# Patient Record
Sex: Female | Born: 1960
Health system: Southern US, Community
[De-identification: ages and names within clinical notes are randomized; demographics above are authoritative.]

## PROBLEM LIST (undated history)

## (undated) DIAGNOSIS — R102 Pelvic and perineal pain: Secondary | ICD-10-CM

## (undated) DIAGNOSIS — J45909 Unspecified asthma, uncomplicated: Secondary | ICD-10-CM

## (undated) DIAGNOSIS — G43909 Migraine, unspecified, not intractable, without status migrainosus: Secondary | ICD-10-CM

## (undated) DIAGNOSIS — I10 Essential (primary) hypertension: Secondary | ICD-10-CM

## (undated) DIAGNOSIS — R87629 Unspecified abnormal cytological findings in specimens from vagina: Secondary | ICD-10-CM

## (undated) DIAGNOSIS — B009 Herpesviral infection, unspecified: Secondary | ICD-10-CM

## (undated) DIAGNOSIS — N649 Disorder of breast, unspecified: Secondary | ICD-10-CM

## (undated) DIAGNOSIS — C50919 Malignant neoplasm of unspecified site of unspecified female breast: Secondary | ICD-10-CM

## (undated) DIAGNOSIS — R319 Hematuria, unspecified: Secondary | ICD-10-CM

## (undated) DIAGNOSIS — Z8619 Personal history of other infectious and parasitic diseases: Secondary | ICD-10-CM

## (undated) DIAGNOSIS — J111 Influenza due to unidentified influenza virus with other respiratory manifestations: Secondary | ICD-10-CM

## (undated) HISTORY — DX: Unspecified asthma, uncomplicated: J45.909

## (undated) HISTORY — DX: Migraine, unspecified, not intractable, without status migrainosus: G43.909

## (undated) HISTORY — PX: BREAST BIOPSY: SHX20

## (undated) HISTORY — DX: Personal history of other infectious and parasitic diseases: Z86.19

## (undated) HISTORY — DX: Pelvic and perineal pain: R10.2

## (undated) HISTORY — DX: Influenza due to unidentified influenza virus with other respiratory manifestations: J11.1

## (undated) HISTORY — DX: Unspecified abnormal cytological findings in specimens from vagina: R87.629

## (undated) HISTORY — DX: Disorder of breast, unspecified: N64.9

## (undated) HISTORY — DX: Hematuria, unspecified: R31.9

## (undated) HISTORY — DX: Malignant neoplasm of unspecified site of unspecified female breast: C50.919

## (undated) HISTORY — PX: OTHER SURGICAL HISTORY: SHX169

## (undated) HISTORY — PX: BREAST LUMPECTOMY: SHX2

## (undated) HISTORY — DX: Herpesviral infection, unspecified: B00.9

---

## 2006-03-12 ENCOUNTER — Emergency Department (HOSPITAL_COMMUNITY): Admission: EM | Admit: 2006-03-12 | Discharge: 2006-03-12 | Payer: Self-pay | Admitting: Emergency Medicine

## 2006-05-23 ENCOUNTER — Emergency Department (HOSPITAL_COMMUNITY): Admission: EM | Admit: 2006-05-23 | Discharge: 2006-05-23 | Payer: Self-pay | Admitting: Emergency Medicine

## 2006-07-31 ENCOUNTER — Emergency Department (HOSPITAL_COMMUNITY): Admission: EM | Admit: 2006-07-31 | Discharge: 2006-07-31 | Payer: Self-pay | Admitting: Emergency Medicine

## 2010-03-30 ENCOUNTER — Emergency Department (HOSPITAL_COMMUNITY): Admission: EM | Admit: 2010-03-30 | Discharge: 2010-03-30 | Payer: Self-pay | Admitting: Emergency Medicine

## 2010-04-03 ENCOUNTER — Emergency Department (HOSPITAL_COMMUNITY)
Admission: EM | Admit: 2010-04-03 | Discharge: 2010-04-03 | Payer: Self-pay | Source: Home / Self Care | Admitting: Emergency Medicine

## 2010-08-20 LAB — DIFFERENTIAL
Eosinophils Relative: 2 % (ref 0–5)
Monocytes Absolute: 0.6 10*3/uL (ref 0.1–1.0)
Monocytes Relative: 8 % (ref 3–12)
Neutro Abs: 4.6 10*3/uL (ref 1.7–7.7)

## 2010-08-20 LAB — CBC
HCT: 36.8 % (ref 36.0–46.0)
Hemoglobin: 12.6 g/dL (ref 12.0–15.0)
MCH: 26.3 pg (ref 26.0–34.0)
MCHC: 34.2 g/dL (ref 30.0–36.0)
RDW: 13.4 % (ref 11.5–15.5)

## 2010-08-20 LAB — POCT I-STAT, CHEM 8
BUN: 6 mg/dL (ref 6–23)
Chloride: 104 mEq/L (ref 96–112)
Creatinine, Ser: 0.7 mg/dL (ref 0.4–1.2)
Potassium: 3.3 mEq/L — ABNORMAL LOW (ref 3.5–5.1)
Sodium: 138 mEq/L (ref 135–145)

## 2010-08-20 LAB — POCT CARDIAC MARKERS
CKMB, poc: <1 ng/mL — ABNORMAL LOW (ref 1.0–8.0)
Myoglobin, poc: 114 ng/mL (ref 12–200)

## 2012-07-19 ENCOUNTER — Other Ambulatory Visit (HOSPITAL_COMMUNITY)
Admission: RE | Admit: 2012-07-19 | Discharge: 2012-07-19 | Disposition: A | Discharge status: Home / Self Care | Payer: BC Managed Care – PPO | Source: Ambulatory Visit | Attending: Obstetrics and Gynecology | Admitting: Obstetrics and Gynecology

## 2012-07-19 ENCOUNTER — Other Ambulatory Visit (HOSPITAL_COMMUNITY): Payer: Self-pay | Admitting: Adult Health

## 2012-07-19 DIAGNOSIS — Z01419 Encounter for gynecological examination (general) (routine) without abnormal findings: Secondary | ICD-10-CM | POA: Insufficient documentation

## 2012-07-19 DIAGNOSIS — Z1151 Encounter for screening for human papillomavirus (HPV): Secondary | ICD-10-CM | POA: Insufficient documentation

## 2012-07-19 DIAGNOSIS — N6310 Unspecified lump in the right breast, unspecified quadrant: Secondary | ICD-10-CM

## 2012-08-03 ENCOUNTER — Ambulatory Visit (HOSPITAL_COMMUNITY)
Admission: RE | Admit: 2012-08-03 | Discharge: 2012-08-03 | Disposition: A | Discharge status: Home / Self Care | Payer: BC Managed Care – PPO | Source: Ambulatory Visit | Attending: Adult Health | Admitting: Adult Health

## 2012-08-03 ENCOUNTER — Other Ambulatory Visit (HOSPITAL_COMMUNITY): Payer: Self-pay | Admitting: Adult Health

## 2012-08-03 DIAGNOSIS — N6310 Unspecified lump in the right breast, unspecified quadrant: Secondary | ICD-10-CM

## 2012-08-03 DIAGNOSIS — N63 Unspecified lump in unspecified breast: Secondary | ICD-10-CM | POA: Insufficient documentation

## 2013-01-26 ENCOUNTER — Other Ambulatory Visit: Payer: Self-pay | Admitting: Adult Health

## 2013-01-26 DIAGNOSIS — Z09 Encounter for follow-up examination after completed treatment for conditions other than malignant neoplasm: Secondary | ICD-10-CM

## 2013-01-26 DIAGNOSIS — R921 Mammographic calcification found on diagnostic imaging of breast: Secondary | ICD-10-CM

## 2013-02-15 ENCOUNTER — Other Ambulatory Visit: Payer: Self-pay | Admitting: Adult Health

## 2013-02-15 ENCOUNTER — Ambulatory Visit (HOSPITAL_COMMUNITY)
Admission: RE | Admit: 2013-02-15 | Discharge: 2013-02-15 | Disposition: A | Discharge status: Home / Self Care | Payer: BC Managed Care – PPO | Source: Ambulatory Visit | Attending: Adult Health | Admitting: Adult Health

## 2013-02-15 DIAGNOSIS — Z09 Encounter for follow-up examination after completed treatment for conditions other than malignant neoplasm: Secondary | ICD-10-CM

## 2013-02-15 DIAGNOSIS — R921 Mammographic calcification found on diagnostic imaging of breast: Secondary | ICD-10-CM

## 2013-02-15 DIAGNOSIS — R928 Other abnormal and inconclusive findings on diagnostic imaging of breast: Secondary | ICD-10-CM | POA: Insufficient documentation

## 2013-06-10 ENCOUNTER — Emergency Department (HOSPITAL_COMMUNITY)
Admission: EM | Admit: 2013-06-10 | Discharge: 2013-06-10 | Disposition: A | Discharge status: Home / Self Care | Payer: BC Managed Care – PPO | Attending: Emergency Medicine | Admitting: Emergency Medicine

## 2013-06-10 ENCOUNTER — Encounter (HOSPITAL_COMMUNITY): Payer: Self-pay | Admitting: Emergency Medicine

## 2013-06-10 ENCOUNTER — Emergency Department (HOSPITAL_COMMUNITY): Payer: BC Managed Care – PPO

## 2013-06-10 DIAGNOSIS — R1110 Vomiting, unspecified: Secondary | ICD-10-CM | POA: Insufficient documentation

## 2013-06-10 DIAGNOSIS — R0789 Other chest pain: Secondary | ICD-10-CM | POA: Insufficient documentation

## 2013-06-10 DIAGNOSIS — R300 Dysuria: Secondary | ICD-10-CM | POA: Insufficient documentation

## 2013-06-10 DIAGNOSIS — R112 Nausea with vomiting, unspecified: Secondary | ICD-10-CM | POA: Insufficient documentation

## 2013-06-10 DIAGNOSIS — J209 Acute bronchitis, unspecified: Secondary | ICD-10-CM | POA: Insufficient documentation

## 2013-06-10 DIAGNOSIS — L03119 Cellulitis of unspecified part of limb: Secondary | ICD-10-CM

## 2013-06-10 DIAGNOSIS — J40 Bronchitis, not specified as acute or chronic: Secondary | ICD-10-CM

## 2013-06-10 DIAGNOSIS — L02419 Cutaneous abscess of limb, unspecified: Secondary | ICD-10-CM | POA: Insufficient documentation

## 2013-06-10 DIAGNOSIS — L0390 Cellulitis, unspecified: Secondary | ICD-10-CM

## 2013-06-10 DIAGNOSIS — R109 Unspecified abdominal pain: Secondary | ICD-10-CM | POA: Insufficient documentation

## 2013-06-10 DIAGNOSIS — R6883 Chills (without fever): Secondary | ICD-10-CM | POA: Insufficient documentation

## 2013-06-10 DIAGNOSIS — J029 Acute pharyngitis, unspecified: Secondary | ICD-10-CM | POA: Insufficient documentation

## 2013-06-10 DIAGNOSIS — R32 Unspecified urinary incontinence: Secondary | ICD-10-CM | POA: Insufficient documentation

## 2013-06-10 DIAGNOSIS — I10 Essential (primary) hypertension: Secondary | ICD-10-CM | POA: Insufficient documentation

## 2013-06-10 HISTORY — DX: Essential (primary) hypertension: I10

## 2013-06-10 MED ORDER — DILTIAZEM HCL 100 MG IV SOLR: 5.0000 mg/h | Freq: Once | INTRAVENOUS | Status: DC

## 2013-06-10 MED ORDER — PREDNISONE 10 MG PO TABS: ORAL_TABLET | ORAL | Status: DC

## 2013-06-10 MED ORDER — AMOXICILLIN 500 MG PO CAPS: 500.0000 mg | ORAL_CAPSULE | Freq: Three times a day (TID) | ORAL | Status: DC

## 2013-06-10 MED ORDER — SULFAMETHOXAZOLE-TRIMETHOPRIM 800-160 MG PO TABS: 1.0000 | ORAL_TABLET | Freq: Two times a day (BID) | ORAL | Status: DC

## 2013-06-10 MED ORDER — DILTIAZEM HCL 25 MG/5ML IV SOLN: 5.0000 mg | Freq: Once | INTRAVENOUS | Status: DC

## 2013-06-10 MED ORDER — ALBUTEROL SULFATE HFA 108 (90 BASE) MCG/ACT IN AERS: 2.0000 | INHALATION_SPRAY | RESPIRATORY_TRACT | Status: DC | PRN

## 2013-06-10 MED ORDER — DM-GUAIFENESIN ER 30-600 MG PO TB12: 1.0000 | ORAL_TABLET | Freq: Two times a day (BID) | ORAL | Status: DC

## 2013-06-10 NOTE — ED Notes (Signed)
Pt c/o cough/congestion/n/v and abscess to right thigh

## 2013-06-10 NOTE — Discharge Instructions (Signed)
Drink plenty of fluids. Use the inhaler as needed for wheezing or shortness of breath. Take the antibiotics until gone. Take the mucinex DM for cough. Recheck if you aren't improving over the next several days.

## 2013-06-10 NOTE — ED Provider Notes (Addendum)
CSN: [id number]     Arrival date & time 06/10/13  1331 History   First MD Initiated Contact with Patient 06/10/13 1504 This chart was scribed for Janice Norrie, MD by Anastasia Pall, ED Scribe. This patient was seen in room APA08/APA08 and the patient's care was started at 3:39 PM.      Chief Complaint  Patient presents with  . Cough    The history is provided by the patient. No language interpreter was used.   HPI Comments: Catherine Hansen is a 53 y.o. female who presents to the Emergency Department complaining of  intermittent, cough, productive of clear sputum, with associated post-tussive emesis, onset 3 weeks ago that got worse 3 days ago. She also reports associated rhinorrhea and sore throat, onset today. She reports having fever and chills last week, but denies having chills this week. She reports SOB at night, with associated chest tightness and wheezing, and having to sit up to relieve the symptoms. She states she thinks her SOB is due to chest congestion and wheezing at night. She denies having inhaler/abuterol, and states she last used it 2 years ago. She reports her mild lower abdominal pain associated with coughing. She reports posttussive vomiting for the past week. She denies nausea however. She reports urinary incontinence with coughing onset yesterday. She reports she has been trying to drink a lot of ginger ale and water. She reports a few of her co-workers have been sick at the office. She reports going to Urgent Care 4 days ago, but denies receiving anything from her visit.  She also reports noticing redness over her right thigh that started yesterday and is getting bigger and more painful. She states she think something may have been bitten by something. She's never had abscesses or boils in the past.  She denies current fever, chest pain, chest tightness, diarrhea and any other associated symptoms. She denies h/o smoking, EtOH use. 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  PCP - Chip Boer, PA-C  Past Medical History  Diagnosis Date  . Hypertension    Past Surgical History  Procedure Laterality Date  . Cesarean section     No family history on file. History  Substance Use Topics  . Smoking status: Never Smoker   . Smokeless tobacco: Not on file  . Alcohol Use: No   employed   OB History   Grav Para Term Preterm Abortions TAB SAB Ect Mult Living                 Review of Systems  Constitutional: Positive for chills. Negative for fever.  HENT: Positive for congestion, rhinorrhea and sore throat.   Respiratory: Positive for cough, chest tightness (at night with her SOB) and shortness of breath (evenings).   Cardiovascular: Negative for chest pain.  Gastrointestinal: Positive for nausea, vomiting (post-tussive) and abdominal pain.  Genitourinary: Positive for dysuria.  Skin: Positive for wound (abscess, right thigh).  All other systems reviewed and are negative.    Allergies  Diphenhydramine; Hctz; and Lisinopril  Home Medications  No current outpatient prescriptions on file.  BP 129/107  Pulse 81  Temp(Src) 98.2 F (36.8 C) (Oral)  Resp 18  Ht 5' 8.5" (1.74 m)  Wt 240 lb (108.863 kg)  BMI 35.96 kg/m2  SpO2 99%  Vital signs normal    Physical Exam  Nursing note and vitals reviewed. Constitutional: She is oriented to person, place, and time. She appears well-developed and well-nourished.  Non-toxic appearance.  She does not appear ill. No distress.  HENT:  Head: Normocephalic and atraumatic.  Right Ear: External ear normal.  Left Ear: External ear normal.  Nose: Nose normal. No mucosal edema or rhinorrhea.  Mouth/Throat: Oropharynx is clear and moist and mucous membranes are normal. No dental abscesses or uvula swelling.  Tongue is dry.  Eyes: Conjunctivae and EOM are normal. Pupils are equal, round, and reactive to light.  Neck: Normal range of motion and full passive range of motion without pain. Neck supple.   Cardiovascular: Normal rate, regular rhythm and normal heart sounds.  Exam reveals no gallop and no friction rub.   No murmur heard. Pulmonary/Chest: Effort normal and breath sounds normal. No respiratory distress. She has no wheezes. She has no rhonchi. She has no rales. She exhibits no tenderness and no crepitus.  Abdominal: Soft. Normal appearance and bowel sounds are normal. She exhibits no distension. There is no tenderness. There is no rebound and no guarding.  Musculoskeletal: Normal range of motion. She exhibits no edema and no tenderness.  Moves all extremities well.   Neurological: She is alert and oriented to person, place, and time. She has normal strength. No cranial nerve deficit.  Skin: Skin is warm, dry and intact. No rash noted. No erythema. No pallor.  10x10 cm area of redness over her mid medial right thigh. No distinct area of induration. More intensely red in center.   Psychiatric: She has a normal mood and affect. Her speech is normal and behavior is normal. Her mood appears not anxious.    ED Course  Procedures (including critical care time)  DIAGNOSTIC STUDIES: Oxygen Saturation is 99% on room air, normal by my interpretation.    COORDINATION OF CARE: 3:48 PM-Discussed treatment plan which includes [place] over pt's abscess with pt at bedside and pt agreed to plan. Discussed bug bite information with pt.   Bedside ultrasound done to look for abscess cavity. The area of redness was scanned in its entirety. 2 views were obtained, one trans sagittal and one sagittal view were safer archived. The views were obtained over the enter area with the most intense redness. There is no abscess cavity visualized.    3:53 PM - Will order CXR, walking test. Pt denies needing breathing treatment, due to no SOB walking into ED.  4:50 PM - Discussed negative imaging results. Pt was ambulated by nursing staff. Pulse ox remained 98% on room air. Pt denies SOB during ambulation.  4:55 PM  - Discussed discharge with pt.   Labs Review Labs Reviewed - No data to display Imaging Review Dg Chest 2 View  06/10/2013   CLINICAL DATA:  Cough for 2 weeks. Abdominal pain with nausea and vomiting.  EXAM: CHEST  2 VIEW  COMPARISON:  CT CHEST W/CM dated 04/03/2010; DG CHEST 2 VIEW dated 04/03/2010  FINDINGS: There is suboptimal inspiration on the frontal examination. The heart size and mediastinal contours are stable. There is mild bibasilar atelectasis. No edema, confluent airspace opacity or pleural effusion is demonstrated. The osseous structures appear intact.  IMPRESSION: Stable mild cardiomegaly and basilar atelectasis. No acute cardiopulmonary process.   Electronically Signed   By: Camie Patience M.D.   On: 06/10/2013 16:26    EKG Interpretation   None       MDM   1. Bronchitis   2. Cellulitis     New Prescriptions   ALBUTEROL (PROVENTIL HFA;VENTOLIN HFA) 108 (90 BASE) MCG/ACT INHALER    Inhale 2 puffs into the  lungs every 4 (four) hours as needed for wheezing or shortness of breath.   AMOXICILLIN (AMOXIL) 500 MG CAPSULE    Take 1 capsule (500 mg total) by mouth 3 (three) times daily.   DEXTROMETHORPHAN-GUAIFENESIN (MUCINEX DM) 30-600 MG PER 12 HR TABLET    Take 1 tablet by mouth 2 (two) times daily.   PREDNISONE (DELTASONE) 20 MG TABLET    Take 3 po QD x 3d , then 2 po QD x 3d then 1 po QD x 3d   SULFAMETHOXAZOLE-TRIMETHOPRIM (SEPTRA DS) 800-160 MG PER TABLET    Take 1 tablet by mouth every 12 (twelve) hours.    Plan discharge  Rolland Porter, MD, FACEP    I personally performed the services described in this documentation, which was scribed in my presence. The recorded information has been reviewed and considered.  Rolland Porter, MD, [place], MD 06/10/13 [place], MD 06/10/13 [place], MD 06/10/13 [id number]

## 2013-08-08 ENCOUNTER — Encounter (INDEPENDENT_AMBULATORY_CARE_PROVIDER_SITE_OTHER): Payer: Self-pay

## 2013-08-08 ENCOUNTER — Encounter: Payer: Self-pay | Admitting: Adult Health

## 2013-08-08 ENCOUNTER — Ambulatory Visit (INDEPENDENT_AMBULATORY_CARE_PROVIDER_SITE_OTHER): Payer: BC Managed Care – PPO | Admitting: Adult Health

## 2013-08-08 VITALS — BP 112/70 | HR 76 | Ht 68.5 in | Wt 242.0 lb

## 2013-08-08 DIAGNOSIS — Z8619 Personal history of other infectious and parasitic diseases: Secondary | ICD-10-CM

## 2013-08-08 DIAGNOSIS — Z01419 Encounter for gynecological examination (general) (routine) without abnormal findings: Secondary | ICD-10-CM

## 2013-08-08 DIAGNOSIS — Z1212 Encounter for screening for malignant neoplasm of rectum: Secondary | ICD-10-CM

## 2013-08-08 HISTORY — DX: Personal history of other infectious and parasitic diseases: Z86.19

## 2013-08-08 LAB — HEMOCCULT GUIAC POC 1CARD (OFFICE): FECAL OCCULT BLD: NEGATIVE

## 2013-08-08 MED ORDER — VALACYCLOVIR HCL 500 MG PO TABS: 500.0000 mg | ORAL_TABLET | Freq: Two times a day (BID) | ORAL | Status: DC

## 2013-08-08 NOTE — Progress Notes (Signed)
Patient ID: Catherine Hansen, female   DOB: 1961/04/23, 53 y.o.   MRN: [id number] History of Present Illness: Catherine Hansen is a 53 year old black female in for a physical, she hand a normal pap with negative HPV 07/19/12 and labs then also.   Current Medications, Allergies, Past Medical History, Past Surgical History, Family History and Social History were reviewed in [employer] record.     Review of Systems: Patient denies any blurred vision, shortness of breath, chest pain, abdominal pain, problems with bowel movements, urination, or intercourse, not having sex. No joint pain or mood swings.LMP 02/2012 then spotted in mid summer 2014 done since then.Has some frontal headaches will get eye exam.Has some leg cramps and take yellow mustard.    Physical Exam:BP 112/70  Pulse 76  Ht 5' 8.5" (1.74 m)  Wt 242 lb (109.77 kg)  BMI 36.26 kg/m2 General:  Well developed, well nourished, no acute distress Skin:  Warm and dry Neck:  Midline trachea, normal thyroid Lungs; Clear to auscultation bilaterally Breast:  No dominant palpable mass, retraction, or nipple discharge Cardiovascular: Regular rate and rhythm Abdomen:  Soft, non tender, no hepatosplenomegaly Pelvic:  External genitalia is normal in appearance.  The vagina is normal in appearance.  The cervix is bulbous.  Uterus is felt to be normal size, shape, and contour.  No adnexal masses or tenderness noted. Rectal: Good sphincter tone, no polyps, or hemorrhoids felt.  Hemoccult negative. Extremities:  No swelling or varicosities noted Psych:  No mood changes, alert and cooperative,seems happy   Impression: Yearly gyn exam no pap History of herpes    Plan: Refilled valtrex prn Physical in 1 year Mammogram now and yearly Colonoscopy advised Labs at [facility]

## 2013-08-08 NOTE — Patient Instructions (Signed)
Physical yearly Mammogram yearly [phone] Get colonoscopy  Labs with PCP

## 2013-08-24 ENCOUNTER — Encounter: Payer: Self-pay | Admitting: Adult Health

## 2013-09-13 ENCOUNTER — Ambulatory Visit (HOSPITAL_COMMUNITY)
Admission: RE | Admit: 2013-09-13 | Discharge: 2013-09-13 | Disposition: A | Discharge status: Home / Self Care | Payer: BC Managed Care – PPO | Source: Ambulatory Visit | Attending: Adult Health | Admitting: Adult Health

## 2013-09-13 DIAGNOSIS — Z09 Encounter for follow-up examination after completed treatment for conditions other than malignant neoplasm: Secondary | ICD-10-CM | POA: Insufficient documentation

## 2013-09-13 DIAGNOSIS — R928 Other abnormal and inconclusive findings on diagnostic imaging of breast: Secondary | ICD-10-CM | POA: Insufficient documentation

## 2014-04-09 ENCOUNTER — Encounter: Payer: Self-pay | Admitting: Adult Health

## 2014-08-14 ENCOUNTER — Other Ambulatory Visit: Payer: Self-pay | Admitting: Adult Health

## 2014-08-21 ENCOUNTER — Encounter: Payer: Self-pay | Admitting: Adult Health

## 2014-08-21 ENCOUNTER — Ambulatory Visit (INDEPENDENT_AMBULATORY_CARE_PROVIDER_SITE_OTHER): Payer: BLUE CROSS/BLUE SHIELD | Admitting: Adult Health

## 2014-08-21 VITALS — BP 124/78 | HR 76 | Ht 67.5 in | Wt 236.0 lb

## 2014-08-21 DIAGNOSIS — Z01419 Encounter for gynecological examination (general) (routine) without abnormal findings: Secondary | ICD-10-CM | POA: Diagnosis not present

## 2014-08-21 DIAGNOSIS — Z1212 Encounter for screening for malignant neoplasm of rectum: Secondary | ICD-10-CM

## 2014-08-21 LAB — HEMOCCULT GUIAC POC 1CARD (OFFICE): FECAL OCCULT BLD: NEGATIVE

## 2014-08-21 NOTE — Progress Notes (Signed)
Patient ID: Catherine Hansen, female   DOB: 04-06-1961, 54 y.o.   MRN: [id number] History of Present Illness: Terecia is a 54 year old black female in for well woman gyn exam.She had a normal pap with negative HPV 07/19/12.   Current Medications, Allergies, Past Medical History, Past Surgical History, Family History and Social History were reviewed in [employer] record.     Review of Systems: Patient denies any headaches, hearing loss, fatigue, blurred vision, shortness of breath, chest pain, abdominal pain, problems with bowel movements, urination, or intercourse. No mood swings.She has swelling and some pain in right knee, had injury after Christmas and has seen PCP.Had headache and had BP meds changed and is good.    Physical Exam:BP 124/78 mmHg  Pulse 76  Ht 5' 7.5" (1.715 m)  Wt 236 lb (107.049 kg)  BMI 36.40 kg/m2 General:  Well developed, well nourished, no acute distress Skin:  Warm and dry Neck:  Midline trachea, normal thyroid, good ROM, no lymphadenopathy Lungs; Clear to auscultation bilaterally Breast:  No dominant palpable mass, retraction, or nipple discharge Cardiovascular: Regular rate and rhythm Abdomen:  Soft, non tender, no hepatosplenomegaly Pelvic:  External genitalia is normal in appearance, no lesions.  The vagina is normal in appearance. Urethra has no lesions or masses. The cervix is smooth.  Uterus is felt to be normal size, shape, and contour.  No adnexal masses or tenderness noted.Bladder is non tender, no masses felt. Rectal: Good sphincter tone, no polyps, or hemorrhoids felt.  Hemoccult negative. Extremities/musculoskeletal:  No swelling or varicosities noted, no clubbing or cyanosis Psych:  No mood changes, alert and cooperative,seems happy   Impression: Well woman gyn exam no pap   Plan: Pap and physical in 1 year  Mammogram yearly  Labs with PCP Colonoscopy advised again If has sex use condoms and astro glide

## 2014-08-21 NOTE — Patient Instructions (Addendum)
Physical and pap in 1 year Mammogram yearly  Labs with PCP Colonoscopy advised USe condoms  Try ASTRO GLIDE if has sex

## 2014-09-04 ENCOUNTER — Telehealth: Payer: Self-pay | Admitting: Adult Health

## 2014-09-04 ENCOUNTER — Other Ambulatory Visit: Payer: Self-pay | Admitting: Adult Health

## 2014-09-04 NOTE — Telephone Encounter (Signed)
Spoke with pt. Pt is needing a refill on Valtrex. She uses [facility] in [place]. Thanks!! [place]

## 2014-09-04 NOTE — Telephone Encounter (Signed)
Refilled valtrex x 3

## 2014-09-25 ENCOUNTER — Telehealth: Payer: Self-pay | Admitting: Adult Health

## 2014-09-25 NOTE — Telephone Encounter (Signed)
Left message x 1. JSY 

## 2014-09-28 NOTE — Telephone Encounter (Signed)
Pt called wanting the Valtrex rx to say prn refills instead of 3 refills. I looked back in her chart and last physical appt, 08/08/13,  she was given prn refills. I spoke with Chrystal, RN and she advised it should be fine to change to prn refills. [facility] notified and it was changed. [place]

## 2014-10-17 ENCOUNTER — Other Ambulatory Visit: Payer: Self-pay | Admitting: Adult Health

## 2014-10-17 DIAGNOSIS — R921 Mammographic calcification found on diagnostic imaging of breast: Secondary | ICD-10-CM

## 2014-11-06 ENCOUNTER — Ambulatory Visit (HOSPITAL_COMMUNITY)
Admission: RE | Admit: 2014-11-06 | Discharge: 2014-11-06 | Disposition: A | Discharge status: Home / Self Care | Payer: BLUE CROSS/BLUE SHIELD | Source: Ambulatory Visit | Attending: Adult Health | Admitting: Adult Health

## 2014-11-06 ENCOUNTER — Ambulatory Visit (HOSPITAL_COMMUNITY)
Admission: RE | Admit: 2014-11-06 | Discharge: 2014-11-06 | Disposition: A | Discharge status: Home / Self Care | Payer: BLUE CROSS/BLUE SHIELD | Source: Ambulatory Visit | Attending: Family Medicine | Admitting: Family Medicine

## 2014-11-06 ENCOUNTER — Other Ambulatory Visit (HOSPITAL_COMMUNITY): Payer: Self-pay | Admitting: Family Medicine

## 2014-11-06 DIAGNOSIS — R928 Other abnormal and inconclusive findings on diagnostic imaging of breast: Secondary | ICD-10-CM | POA: Diagnosis present

## 2014-11-06 DIAGNOSIS — R921 Mammographic calcification found on diagnostic imaging of breast: Secondary | ICD-10-CM

## 2014-11-06 DIAGNOSIS — N6320 Unspecified lump in the left breast, unspecified quadrant: Secondary | ICD-10-CM

## 2014-12-25 ENCOUNTER — Ambulatory Visit (INDEPENDENT_AMBULATORY_CARE_PROVIDER_SITE_OTHER): Payer: BLUE CROSS/BLUE SHIELD | Admitting: Adult Health

## 2014-12-25 ENCOUNTER — Encounter: Payer: Self-pay | Admitting: Adult Health

## 2014-12-25 VITALS — BP 130/78 | HR 76 | Ht 68.5 in | Wt 233.5 lb

## 2014-12-25 DIAGNOSIS — R319 Hematuria, unspecified: Secondary | ICD-10-CM

## 2014-12-25 DIAGNOSIS — R102 Pelvic and perineal pain: Secondary | ICD-10-CM | POA: Diagnosis not present

## 2014-12-25 HISTORY — DX: Pelvic and perineal pain: R10.2

## 2014-12-25 HISTORY — DX: Hematuria, unspecified: R31.9

## 2014-12-25 LAB — POCT URINALYSIS DIPSTICK
Glucose, UA: NEGATIVE
Leukocytes, UA: NEGATIVE
Nitrite, UA: NEGATIVE
Protein, UA: NEGATIVE

## 2014-12-25 MED ORDER — HYDROCODONE-ACETAMINOPHEN 5-325 MG PO TABS: 1.0000 | ORAL_TABLET | Freq: Four times a day (QID) | ORAL | Status: DC | PRN

## 2014-12-25 NOTE — Patient Instructions (Signed)
Hematuria Hematuria is blood in your urine. It can be caused by a bladder infection, kidney infection, prostate infection, kidney stone, or cancer of your urinary tract. Infections can usually be treated with medicine, and a kidney stone usually will pass through your urine. If neither of these is the cause of your hematuria, further workup to find out the reason may be needed. It is very important that you tell your health care provider about any blood you see in your urine, even if the blood stops without treatment or happens without causing pain. Blood in your urine that happens and then stops and then happens again can be a symptom of a very serious condition. Also, pain is not a symptom in the initial stages of many urinary cancers. HOME CARE INSTRUCTIONS   Drink lots of fluid, 3-4 quarts a day. If you have been diagnosed with an infection, cranberry juice is especially recommended, in addition to large amounts of water.  Avoid caffeine, tea, and carbonated beverages because they tend to irritate the bladder.  Avoid alcohol because it may irritate the prostate.  Take all medicines as directed by your health care provider.  If you were prescribed an antibiotic medicine, finish it all even if you start to feel better.  If you have been diagnosed with a kidney stone, follow your health care provider's instructions regarding straining your urine to catch the stone.  Empty your bladder often. Avoid holding urine for long periods of time.  After a bowel movement, women should cleanse front to back. Use each tissue only once.  Empty your bladder before and after sexual intercourse if you are a female. SEEK MEDICAL CARE IF:  You develop back pain.  You have a fever.  You have a feeling of sickness in your stomach (nausea) or vomiting.  Your symptoms are not better in 3 days. Return sooner if you are getting worse. SEEK IMMEDIATE MEDICAL CARE IF:   You develop severe vomiting and are  unable to keep the medicine down.  You develop severe back or abdominal pain despite taking your medicines.  You begin passing a large amount of blood or clots in your urine.  You feel extremely weak or faint, or you pass out. MAKE SURE YOU:   Understand these instructions.  Will watch your condition.  Will get help right away if you are not doing well or get worse. Document Released: 05/25/2005 Document Revised: 10/09/2013 Document Reviewed: 01/23/2013 [facility] Patient Information 2015 [place], [place]. This information is not intended to replace advice given to you by your health care provider. Make sure you discuss any questions you have with your health care provider. Pelvic Pain Female pelvic pain can be caused by many different things and start from a variety of places. Pelvic pain refers to pain that is located in the lower half of the abdomen and between your hips. The pain may occur over a short period of time (acute) or may be reoccurring (chronic). The cause of pelvic pain may be related to disorders affecting the female reproductive organs (gynecologic), but it may also be related to the bladder, kidney stones, an intestinal complication, or muscle or skeletal problems. Getting help right away for pelvic pain is important, especially if there has been severe, sharp, or a sudden onset of unusual pain. It is also important to get help right away because some types of pelvic pain can be life threatening.  CAUSES  Below are only some of the causes of pelvic pain. The  causes of pelvic pain can be in one of several categories.   Gynecologic.  Pelvic inflammatory disease.  Sexually transmitted infection.  Ovarian cyst or a twisted ovarian ligament (ovarian torsion).  Uterine lining that grows outside the uterus (endometriosis).  Fibroids, cysts, or tumors.  Ovulation.  Pregnancy.  Pregnancy that occurs outside the uterus (ectopic  pregnancy).  Miscarriage.  Labor.  Abruption of the placenta or ruptured uterus.  Infection.  Uterine infection (endometritis).  Bladder infection.  Diverticulitis.  Miscarriage related to a uterine infection (septic abortion).  Bladder.  Inflammation of the bladder (cystitis).  Kidney stone(s).  Gastrointestinal.  Constipation.  Diverticulitis.  Neurologic.  Trauma.  Feeling pelvic pain because of mental or emotional causes (psychosomatic).  Cancers of the bowel or pelvis. EVALUATION  Your caregiver will want to take a careful history of your concerns. This includes recent changes in your health, a careful gynecologic history of your periods (menses), and a sexual history. Obtaining your family history and medical history is also important. Your caregiver may suggest a pelvic exam. A pelvic exam will help identify the location and severity of the pain. It also helps in the evaluation of which organ system may be involved. In order to identify the cause of the pelvic pain and be properly treated, your caregiver may order tests. These tests may include:   A pregnancy test.  Pelvic ultrasonography.  An X-ray exam of the abdomen.  A urinalysis or evaluation of vaginal discharge.  Blood tests. HOME CARE INSTRUCTIONS   Only take over-the-counter or prescription medicines for pain, discomfort, or fever as directed by your caregiver.   Rest as directed by your caregiver.   Eat a balanced diet.   Drink enough fluids to make your urine clear or pale yellow, or as directed.   Avoid sexual intercourse if it causes pain.   Apply warm or cold compresses to the lower abdomen depending on which one helps the pain.   Avoid stressful situations.   Keep a journal of your pelvic pain. Write down when it started, where the pain is located, and if there are things that seem to be associated with the pain, such as food or your menstrual cycle.  Follow up with your  caregiver as directed.  SEEK MEDICAL CARE IF:  Your medicine does not help your pain.  You have abnormal vaginal discharge. SEEK IMMEDIATE MEDICAL CARE IF:   You have heavy bleeding from the vagina.   Your pelvic pain increases.   You feel light-headed or faint.   You have chills.   You have pain with urination or blood in your urine.   You have uncontrolled diarrhea or vomiting.   You have a fever or persistent symptoms for more than 3 days.  You have a fever and your symptoms suddenly get worse.   You are being physically or sexually abused.  MAKE SURE YOU:  Understand these instructions.  Will watch your condition.  Will get help if you are not doing well or get worse. Document Released: 04/21/2004 Document Revised: 10/09/2013 Document Reviewed: 09/14/2011 [facility] Patient Information 2015 [place], [place]. This information is not intended to replace advice given to you by your health care provider. Make sure you discuss any questions you have with your health care provider. Return for gyn [place]

## 2014-12-25 NOTE — Progress Notes (Signed)
Subjective:     Patient ID: Catherine Hansen, female   DOB: Jun 19, 1960, 54 y.o.   MRN: [id number]  HPI Catherine Hansen is a 54 year old black female in complaining of pelvic pain and pressure on and off for 2 months, worse in last 2 weeks, has had sex and it was uncomfortable, no problems with peeing or bowel movements.  Review of Systems Patient denies any headaches, hearing loss, fatigue, blurred vision, shortness of breath, chest pain, problems with bowel movements, urination, or intercourse. No joint pain or mood swings.See HPI for positives. Reviewed past medical,surgical, social and family history. Reviewed medications and allergies.     Objective:   Physical Exam BP 130/78 mmHg  Pulse 76  Ht 5' 8.5" (1.74 m)  Wt 233 lb 8 oz (105.915 kg)  BMI 34.98 kg/m2 urine dipstick:trace blood, Skin warm and dry.Pelvic: external genitalia is normal in appearance no lesions, vagina: scant discharge without odor,urethra has no lesions or masses noted, cervix:smooth and bulbous, uterus: normal size, shape and contour, non tender, no masses felt, adnexa: no masses, mild tenderness noted, across but more to right. Bladder is non tender and no masses felt.  GC/CHL obtained.     Assessment:     Pelvic pain Hematuria     Plan:    Push fluids  UA C&S sent GC/CHL sent Return for GYN [place] in 2 days or so, will talk when results back Rx norco 5-325 mg #30 take 1 every 6 hours prn severe pain, no refills, can use tylenol if mild pain Review handout on pelvic pain and hematuria

## 2014-12-26 LAB — URINALYSIS, ROUTINE W REFLEX MICROSCOPIC
BILIRUBIN UA: NEGATIVE
GLUCOSE, UA: NEGATIVE
Ketones, UA: NEGATIVE
LEUKOCYTES UA: NEGATIVE
Nitrite, UA: NEGATIVE
PH UA: 7 (ref 5.0–7.5)
PROTEIN UA: NEGATIVE
RBC, UA: NEGATIVE
Specific Gravity, UA: 1.012 (ref 1.005–1.030)
Urobilinogen, Ur: 0.2 mg/dL (ref 0.2–1.0)

## 2014-12-26 LAB — GC/CHLAMYDIA PROBE AMP
CHLAMYDIA, DNA PROBE: NEGATIVE
Neisseria gonorrhoeae by PCR: NEGATIVE

## 2014-12-27 ENCOUNTER — Ambulatory Visit (INDEPENDENT_AMBULATORY_CARE_PROVIDER_SITE_OTHER): Payer: BLUE CROSS/BLUE SHIELD

## 2014-12-27 DIAGNOSIS — R102 Pelvic and perineal pain unspecified side: Secondary | ICD-10-CM

## 2014-12-27 LAB — URINE CULTURE

## 2014-12-27 NOTE — Progress Notes (Signed)
US PELVIC TA/TV: heterogenous anteverted uterus w/a 4 x 3.9 x 3.7cm central fibroid(submucosal),unable to see endometrium because of fibroid,(#2) fundal intramural  fibroid 1.4 x 1.9 x 1.1cm,normal ov's bilat, ov's appear to be mobile,no pain during [place].

## 2014-12-28 ENCOUNTER — Telehealth: Payer: Self-pay | Admitting: Adult Health

## 2014-12-28 NOTE — Telephone Encounter (Signed)
Pt aware of urine and [place] results

## 2015-04-10 ENCOUNTER — Other Ambulatory Visit: Payer: Self-pay | Admitting: Adult Health

## 2015-04-10 DIAGNOSIS — Z09 Encounter for follow-up examination after completed treatment for conditions other than malignant neoplasm: Secondary | ICD-10-CM

## 2015-05-14 ENCOUNTER — Ambulatory Visit (HOSPITAL_COMMUNITY)
Admission: RE | Admit: 2015-05-14 | Discharge: 2015-05-14 | Disposition: A | Discharge status: Home / Self Care | Payer: BLUE CROSS/BLUE SHIELD | Source: Ambulatory Visit | Attending: Adult Health | Admitting: Adult Health

## 2015-05-14 DIAGNOSIS — Z09 Encounter for follow-up examination after completed treatment for conditions other than malignant neoplasm: Secondary | ICD-10-CM | POA: Insufficient documentation

## 2015-05-14 DIAGNOSIS — R928 Other abnormal and inconclusive findings on diagnostic imaging of breast: Secondary | ICD-10-CM | POA: Diagnosis not present

## 2015-08-27 ENCOUNTER — Encounter: Payer: Self-pay | Admitting: Adult Health

## 2015-08-27 ENCOUNTER — Ambulatory Visit (INDEPENDENT_AMBULATORY_CARE_PROVIDER_SITE_OTHER): Payer: BLUE CROSS/BLUE SHIELD | Admitting: Adult Health

## 2015-08-27 ENCOUNTER — Other Ambulatory Visit (HOSPITAL_COMMUNITY)
Admission: RE | Admit: 2015-08-27 | Discharge: 2015-08-27 | Disposition: A | Discharge status: Home / Self Care | Payer: BLUE CROSS/BLUE SHIELD | Source: Ambulatory Visit | Attending: Adult Health | Admitting: Adult Health

## 2015-08-27 VITALS — BP 132/80 | HR 102 | Temp 101.7°F | Ht 68.5 in | Wt 227.0 lb

## 2015-08-27 DIAGNOSIS — Z1151 Encounter for screening for human papillomavirus (HPV): Secondary | ICD-10-CM | POA: Insufficient documentation

## 2015-08-27 DIAGNOSIS — J111 Influenza due to unidentified influenza virus with other respiratory manifestations: Secondary | ICD-10-CM

## 2015-08-27 DIAGNOSIS — Z01411 Encounter for gynecological examination (general) (routine) with abnormal findings: Secondary | ICD-10-CM | POA: Diagnosis present

## 2015-08-27 DIAGNOSIS — Z1212 Encounter for screening for malignant neoplasm of rectum: Secondary | ICD-10-CM

## 2015-08-27 DIAGNOSIS — Z113 Encounter for screening for infections with a predominantly sexual mode of transmission: Secondary | ICD-10-CM | POA: Insufficient documentation

## 2015-08-27 DIAGNOSIS — Z01419 Encounter for gynecological examination (general) (routine) without abnormal findings: Secondary | ICD-10-CM | POA: Diagnosis not present

## 2015-08-27 HISTORY — DX: Influenza due to unidentified influenza virus with other respiratory manifestations: J11.1

## 2015-08-27 LAB — HEMOCCULT GUIAC POC 1CARD (OFFICE): Fecal Occult Blood, POC: NEGATIVE

## 2015-08-27 MED ORDER — OSELTAMIVIR PHOSPHATE 75 MG PO CAPS: 75.0000 mg | ORAL_CAPSULE | Freq: Two times a day (BID) | ORAL | Status: DC

## 2015-08-27 MED ORDER — PROMETHAZINE HCL 25 MG PO TABS: 25.0000 mg | ORAL_TABLET | Freq: Four times a day (QID) | ORAL | Status: DC | PRN

## 2015-08-27 MED ORDER — VALACYCLOVIR HCL 500 MG PO TABS: ORAL_TABLET | ORAL | Status: DC

## 2015-08-27 NOTE — Progress Notes (Addendum)
Patient ID: Catherine Hansen, female   DOB: 05-May-1961, 55 y.o.   MRN: [id number] History of Present Illness: Catherine Hansen is a  55 year old black female in for a well woman gyn exam and pap.She says she started feeling bad yesterday afternoon, has cough, fever and vomiting with body aches, thinks she has the flu.She says her stomach is sore too.Denies any diarrhea.She works in home health care. PCP is [facility].   Current Medications, Allergies, Past Medical History, Past Surgical History, Family History and Social History were reviewed in [employer] record.     Review of Systems: Patient denies any headaches, hearing loss, fatigue, blurred vision, shortness of breath, chest pain, problems with bowel movements, urination, or intercourse. No joint pain or mood swings. See HPI for positives.   Physical Exam:BP 132/80 mmHg  Pulse 102  Temp(Src) 101.7 F (38.7 C)  Ht 5' 8.5" (1.74 m)  Wt 227 lb (102.967 kg)  BMI 34.01 kg/m2 General:  Well developed, well nourished, no acute distress Skin:  Warm and dry Neck:  Midline trachea, normal thyroid, good ROM, no lymphadenopathy, ears and throat are clear, no redness or swelling Lungs; Clear to auscultation bilaterally Breast:  No dominant palpable mass, retraction, or nipple discharge Cardiovascular: Regular rate and rhythm Abdomen:  Soft, non tender, no hepatosplenomegaly Pelvic:  External genitalia is normal in appearance, no lesions.  The vagina is normal in appearance. Urethra has no lesions or masses. The cervix is bulbous. Pap with HPV and GC/CHL performed. Uterus is felt to be normal size, shape, and contour.  No adnexal masses, mild  tenderness noted.Bladder is non tender, no masses felt. Rectal: Good sphincter tone, no polyps, or hemorrhoids felt.  Hemoccult negative. Extremities/musculoskeletal:  No swelling or varicosities noted, no clubbing or cyanosis Psych:  No mood changes, alert and cooperative,seems  happy   Impression: Well woman gyn exam and pap Flu     Plan: Rx tamiflu 75 mg #10 take 1 bid Refilled valtrex 1 gm 1 bid #10 with 3 refills Rx phenergan 25 mg #30 take 1 every 6 hours prn with 1 refill Push fluids, take delsym and tylenol and follow up with [facility] if needed Mammogram yearly Labs with PCP Hhysical in 1 year, pap in 3 if normal Colonoscopy advised

## 2015-08-27 NOTE — Patient Instructions (Addendum)
Physical in 1 year Mammogram yearly Labs with [facility] Colonoscopy Advised Rest push fluids and take tamiflu, tylenol  Follow up with [facility] Try delsym

## 2015-08-29 LAB — CYTOLOGY - PAP

## 2015-11-13 ENCOUNTER — Telehealth: Payer: Self-pay | Admitting: *Deleted

## 2015-11-13 DIAGNOSIS — R928 Other abnormal and inconclusive findings on diagnostic imaging of breast: Secondary | ICD-10-CM

## 2015-11-13 NOTE — Telephone Encounter (Signed)
Order in for mammogram and [place] left breast, appt  6/20 at 2:10

## 2015-11-13 NOTE — Telephone Encounter (Signed)
Pt aware appt 6/20 at 2 :20 pm at [facility]

## 2015-11-19 ENCOUNTER — Other Ambulatory Visit: Payer: Self-pay | Admitting: Adult Health

## 2015-11-19 DIAGNOSIS — R928 Other abnormal and inconclusive findings on diagnostic imaging of breast: Secondary | ICD-10-CM

## 2015-11-26 ENCOUNTER — Ambulatory Visit (HOSPITAL_COMMUNITY)
Admission: RE | Admit: 2015-11-26 | Discharge: 2015-11-26 | Disposition: A | Discharge status: Home / Self Care | Payer: BLUE CROSS/BLUE SHIELD | Source: Ambulatory Visit | Attending: Adult Health | Admitting: Adult Health

## 2015-11-26 ENCOUNTER — Other Ambulatory Visit: Payer: Self-pay | Admitting: Adult Health

## 2015-11-26 DIAGNOSIS — R928 Other abnormal and inconclusive findings on diagnostic imaging of breast: Secondary | ICD-10-CM | POA: Diagnosis present

## 2015-11-26 DIAGNOSIS — R921 Mammographic calcification found on diagnostic imaging of breast: Secondary | ICD-10-CM | POA: Diagnosis not present

## 2015-12-03 ENCOUNTER — Ambulatory Visit
Admission: RE | Admit: 2015-12-03 | Discharge: 2015-12-03 | Disposition: A | Discharge status: Home / Self Care | Payer: BLUE CROSS/BLUE SHIELD | Source: Ambulatory Visit | Attending: Adult Health | Admitting: Adult Health

## 2015-12-03 DIAGNOSIS — R921 Mammographic calcification found on diagnostic imaging of breast: Secondary | ICD-10-CM

## 2015-12-09 ENCOUNTER — Encounter: Payer: Self-pay | Admitting: Adult Health

## 2015-12-09 ENCOUNTER — Telehealth: Payer: Self-pay | Admitting: Adult Health

## 2015-12-09 DIAGNOSIS — C50919 Malignant neoplasm of unspecified site of unspecified female breast: Secondary | ICD-10-CM

## 2015-12-09 DIAGNOSIS — D0501 Lobular carcinoma in situ of right breast: Secondary | ICD-10-CM | POA: Insufficient documentation

## 2015-12-09 HISTORY — DX: Malignant neoplasm of unspecified site of unspecified female breast: C50.919

## 2015-12-09 NOTE — Telephone Encounter (Signed)
Pt aware that biopsy of breast showed cancer and she has appt, 7/14 with [facility]

## 2015-12-20 ENCOUNTER — Ambulatory Visit: Payer: Self-pay | Admitting: Surgery

## 2015-12-20 DIAGNOSIS — D0501 Lobular carcinoma in situ of right breast: Secondary | ICD-10-CM

## 2015-12-20 NOTE — H&P (Signed)
History of Present Illness Catherine Hansen. Catherine Archambeault MD; 12/20/2015 12:24 PM) The patient is a 55 year old female who presents with a complaint of Breast problems. Referred by Catherine Hansen for LCIS right breast PCP - Catherine Hansen  This is a 55 year old female who recently underwent short-term follow-up mammogram for left breast asymmetry. Her mammogram was performed on 11/26/15. She had a new finding of a 3 mm area of calcifications in the upper outer right breast. She has persistent asymmetry in the left breast that is unchanged. She underwent stereotactic biopsy on 12/03/15 with placement of a clip. 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 She presents now for surgical evaluation.  Menarche - age 35 First pregnancy - age 35 Breastfeed - no Hormones - OCP 19 years Menopause - age 49 Previous core biopsy right breast Catherine Hansen - benign FH - no breast cancer; mother - ovarian cancer at young age  CLINICAL DATA: Patient for short-term follow-up probably benign left breast asymmetry.  EXAM: 2D DIGITAL DIAGNOSTIC BILATERAL MAMMOGRAM WITH CAD AND ADJUNCT TOMO  ULTRASOUND LEFT BREAST  COMPARISON: Previous exam(s).  ACR Breast Density Category b: There are scattered areas of fibroglandular density.  FINDINGS: Magnification CC and true lateral views of the left breast demonstrate a new 3 mm group of round calcifications within the upper-outer right breast middle depth. Persistent asymmetry within the medial left breast middle depth. No additional concerning masses or areas of architectural distortion identified within either breast.  Mammographic images were processed with CAD.  On physical exam, I palpate no discrete mass within the medial left breast.  Targeted ultrasound is performed, showing no definite sonographic correlate for focal asymmetry medial left breast.  IMPRESSION: New group of rounded  calcifications within the upper-outer right breast, indeterminate.  Stable but probably benign focal asymmetry left breast.  RECOMMENDATION: Stereotactic guided core needle biopsy right breast calcifications.  One additional follow-up mammogram of left breast asymmetry in 12 months.  I have discussed the findings and recommendations with the patient. Results were also provided in writing at the conclusion of the visit. If applicable, a reminder letter will be sent to the patient regarding the next appointment.  BI-RADS CATEGORY 4: Suspicious.   Electronically Signed By: Catherine Hansen M.D. On: 11/26/2015 15:05  CLINICAL DATA: Calcifications right breast for which stereotactic biopsy was recommended.  EXAM: RIGHT BREAST STEREOTACTIC CORE NEEDLE BIOPSY  COMPARISON: Previous exams.  FINDINGS: The patient and I discussed the procedure of stereotactic-guided biopsy including benefits and alternatives. We discussed the high likelihood of a successful procedure. We discussed the risks of the procedure including infection, bleeding, tissue injury, clip migration, and inadequate sampling. Informed written consent was given. The usual time out protocol was performed immediately prior to the procedure.  Using sterile technique and 1% Lidocaine as local anesthetic, under stereotactic guidance, a 9 gauge vacuum assisted device was used to perform core needle biopsy of calcifications in the upper-outer quadrant of the right breast using a superior to inferior approach. Specimen radiograph was performed showing multiple calcifications. Specimens with calcifications are identified for pathology.  At the conclusion of the procedure, a tissue marker clip was deployed into the biopsy cavity. Follow-up 2-view mammogram was performed and dictated separately.  IMPRESSION: Stereotactic-guided biopsy of the right breast. No apparent complications.  Electronically Signed: By: Catherine Hansen M.D. On: 12/03/2015 11:32  ADDENDUM REPORT: 12/09/2015 15:08 ADDENDUM: Pathology revealed pleomorphic lobular carcinoma in situ with comedonecrosis and calcifications in  the right breast. This was found to be concordant by Catherine Hansen. Pathology results were discussed with the patient by telephone. The patient reported doing well after the biopsy. Post biopsy instructions and care were reviewed and questions were answered. The patient was encouraged to call The [facility] for any additional concerns. The patient requested surgical consultation in [place] and has been scheduled with Dr. Donnie Hansen at [facility] on December 20, 2015. Pathology results reported by Catherine Raring RN, BSN on 12/09/2015. Electronically Signed By: Catherine Hansen M.D. On: 12/09/2015 15:08   Other Problems (Catherine Hansen, CMA; 12/20/2015 11:24 AM) Anxiety Disorder Arthritis Asthma Back Pain Bladder Problems Cancer Chest pain Gastroesophageal Reflux Disease Heart murmur Hemorrhoids High blood pressure Hypercholesterolemia Migraine Headache  Past Surgical History Catherine Hansen, CMA; 12/20/2015 11:24 AM) Cesarean Section - Multiple  Diagnostic Studies History Catherine Hansen, CMA; 12/20/2015 11:24 AM) Colonoscopy never Mammogram within last year Pap Smear 1-5 years ago  Allergies Catherine Hansen, CMA; 12/20/2015 10:54 AM) Benadryl *ANTIHISTAMINES* Lisinopril *ANTIHYPERTENSIVES* HydroCHLOROthiazide *DIURETICS*  Medication History (Catherine Hansen, CMA; 12/20/2015 10:52 AM) AmLODIPine Besylate (10MG  Tablet, Oral) Active. Cetirizine HCl (10MG  Tablet, Oral) Active. Fluticasone Propionate (50MCG/ACT Suspension, Nasal) Active. Furosemide (40MG  Tablet, Oral) Active. Naproxen (500MG  Tablet, Oral) Active. Spironolactone (50MG  Tablet, Oral) Active. Pravastatin Sodium (20MG  Tablet, Oral) Active. Promethazine  HCl (25MG  Tablet, Oral) Active. Baby Aspirin (81MG  Tablet Chewable, Oral) Active. Medications Reconciled  Social History Catherine Hansen, CMA; 12/20/2015 11:24 AM) Caffeine use Carbonated beverages, Coffee, Tea. No alcohol use No drug use Tobacco use Never smoker.  Family History Catherine Hansen, [place]; 12/20/2015 11:24 AM) Arthritis Mother. Breast Cancer Mother. Cancer Mother. Depression Daughter. Diabetes Mellitus Mother. Heart Disease Mother. Hypertension Brother, Father, Mother, Sister. Kidney Disease Father. Migraine Headache Daughter, Mother. Ovarian Cancer Mother. Respiratory Condition Daughter, Mother.  Pregnancy / Birth History Catherine Hansen, CMA; 12/20/2015 11:24 AM) Age at menarche 38 years. Age of menopause 51-55 Contraceptive History Intrauterine device, Oral contraceptives. Gravida 3 Maternal age 56-20 Para 3     Review of Systems Catherine Hansen CMA; 12/20/2015 11:24 AM) General Present- Fatigue and Night Sweats. Not Present- Appetite Loss, Chills, Fever, Weight Gain and Weight Loss. Skin Present- Change in Wart/Mole and New Lesions. Not Present- Dryness, Hives, Jaundice, Non-Healing Wounds, Rash and Ulcer. HEENT Present- Seasonal Allergies, Sinus Pain and Wears glasses/contact lenses. Not Present- Earache, Hearing Loss, Hoarseness, Nose Bleed, Oral Ulcers, Ringing in the Ears, Sore Throat, Visual Disturbances and Yellow Eyes. Respiratory Present- Snoring. Not Present- Bloody sputum, Chronic Cough, Difficulty Breathing and Wheezing. Breast Present- Breast Mass. Not Present- Breast Pain, Nipple Discharge and Skin Changes. Cardiovascular Present- Difficulty Breathing Lying Down and Leg Cramps. Not Present- Chest Pain, Palpitations, Rapid Heart Rate, Shortness of Breath and Swelling of Extremities. Gastrointestinal Present- Bloating and Indigestion. Not Present- Abdominal Pain, Bloody Stool, Change in Bowel Habits, Chronic diarrhea,  Constipation, Difficulty Swallowing, Excessive gas, Gets full quickly at meals, Hemorrhoids, Nausea, Rectal Pain and Vomiting. Female Genitourinary Present- Frequency and Nocturia. Not Present- Painful Urination, Pelvic Pain and Urgency. Musculoskeletal Present- Joint Pain, Joint Stiffness, Muscle Pain and Swelling of Extremities. Not Present- Back Pain and Muscle Weakness. Neurological Present- Headaches and Tingling. Not Present- Decreased Memory, Fainting, Numbness, Seizures, Tremor, Trouble walking and Weakness. Psychiatric Present- Change in Sleep Pattern. Not Present- Anxiety, Bipolar, Depression, Fearful and Frequent crying. Endocrine Present- Hair Changes and Hot flashes. Not Present- Cold Intolerance, Excessive Hunger, Heat Intolerance and New Diabetes. Hematology Present- Blood Thinners and Easy Bruising.  Not Present- Excessive bleeding, Gland problems, HIV and Persistent Infections.  Vitals (Catherine Hansen CMA; 12/20/2015 10:49 AM) 12/20/2015 10:48 AM Weight: 227.6 lb Height: 68.5in Body Surface Area: 2.17 m Body Mass Index: 34.1 kg/m  Pulse: 58 (Regular)  BP: 122/80 (Sitting, Left Arm, Standard)      Physical Exam Rodman Key K. Miley Blanchett MD; 12/20/2015 12:25 PM)  The physical exam findings are as follows: Note:WDWN in NAD HEENT: EOMI, sclera anicteric Neck: No masses, no thyromegaly Lungs: CTA bilaterally; normal respiratory effort Breast: Symmetric, no palpable lymphadenopathy on either side; no dominant masses in either breasts; no nipple retraction or discharge; healed biopsy site right upper outer quadrant CV: Regular rate and rhythm; no murmurs Abd: +bowel sounds, soft, non-tender, no masses Ext: Well-perfused; no edema Skin: Warm, dry; no sign of jaundice    Assessment & Plan Rodman Key K. Tobyn Osgood MD; 12/20/2015 12:26 PM)  LOBULAR CARCINOMA IN SITU (LCIS) OF RIGHT BREAST (D05.01) Impression: 3 mm right upper outer quadrant  Current Plans Schedule for Surgery -  Right radioactive seed localized lumpectomy. The surgical procedure has been discussed with the patient. Potential risks, benefits, alternative treatments, and expected outcomes have been explained. All of the patient's questions at this time have been answered. The likelihood of reaching the patient's treatment goal is good. The patient understand the proposed surgical procedure and wishes to proceed. Pt Education - [facility] Breast Cancer Information Given - [facility] "Breast Journey" Package  Catherine Hansen. Georgette Dover, MD, [facility]  General/ Trauma Surgery  12/20/2015 12:27 PM

## 2015-12-21 ENCOUNTER — Other Ambulatory Visit: Payer: Self-pay | Admitting: Adult Health

## 2015-12-23 ENCOUNTER — Other Ambulatory Visit: Payer: Self-pay | Admitting: *Deleted

## 2015-12-23 MED ORDER — VALACYCLOVIR HCL 500 MG PO TABS: ORAL_TABLET | ORAL | Status: DC

## 2015-12-25 ENCOUNTER — Encounter: Payer: Self-pay | Admitting: Hematology and Oncology

## 2015-12-25 ENCOUNTER — Telehealth: Payer: Self-pay | Admitting: Hematology and Oncology

## 2015-12-25 NOTE — Telephone Encounter (Signed)
Appointment scheduled with [facility] on 8/1 at 3:45pm. Patient agreed. Demographics verified. Letter to referring.

## 2015-12-30 ENCOUNTER — Other Ambulatory Visit: Payer: Self-pay | Admitting: Surgery

## 2015-12-30 DIAGNOSIS — D0501 Lobular carcinoma in situ of right breast: Secondary | ICD-10-CM

## 2016-01-06 ENCOUNTER — Telehealth: Payer: Self-pay | Admitting: *Deleted

## 2016-01-06 NOTE — Telephone Encounter (Signed)
Received appt date/time from Andrea.  Placed a note for an intake form to be given to the pt at time of check in. 

## 2016-01-07 ENCOUNTER — Ambulatory Visit (HOSPITAL_BASED_OUTPATIENT_CLINIC_OR_DEPARTMENT_OTHER): Payer: BLUE CROSS/BLUE SHIELD | Admitting: Hematology and Oncology

## 2016-01-07 ENCOUNTER — Encounter: Payer: Self-pay | Admitting: Hematology and Oncology

## 2016-01-07 ENCOUNTER — Telehealth: Payer: Self-pay | Admitting: Oncology

## 2016-01-07 ENCOUNTER — Telehealth: Payer: Self-pay | Admitting: Hematology and Oncology

## 2016-01-07 DIAGNOSIS — D0501 Lobular carcinoma in situ of right breast: Secondary | ICD-10-CM

## 2016-01-07 NOTE — Progress Notes (Signed)
[facility] CONSULT NOTE  Patient Care Team: Elin Claggett, PA-C as PCP - General (Family Medicine)  CHIEF COMPLAINTS/PURPOSE OF CONSULTATION:  Newly diagnosed right breast LCIS  HISTORY OF PRESENTING ILLNESS:  Catherine Hansen 55 y.o. female is here because of recent diagnosis of right breast LCIS. Patient underwent a short-term follow-up mammogram of 4 (A this was performed 11/26/2015. This showed 3 mm area of calcifications in the upper outer right breast. Stereotactic biopsy was performed 12/03/2015 that showed pleomorphic LCIS with comedonecrosis and calcifications. She was subsequently seen by Dr.Tsuei who recommended lumpectomy and is here today to discuss this reduction strategies for LCIS.  I reviewed her records extensively and collaborated the history with the patient.  SUMMARY OF ONCOLOGIC HISTORY:   Neoplasm of right breast, primary tumor staging category Tis: lobular carcinoma in situ (LCIS)   12/03/2015 Initial Diagnosis    Right breast biopsy upper outer quadrant: Pleomorphic lobular carcinoma in situ with comedonecrosis and calcifications, grade 2-3     MEDICAL HISTORY:  Past Medical History:  Diagnosis Date  . Asthma   . Breast cancer ([place]) 12/09/2015   Pathology showed pleomorphic lobular carcinoma in situ with comedonecrosis and calcifications   . Flu 08/27/2015  . Hematuria 12/25/2014  . Herpes simplex without mention of complication   . History of herpes simplex infection 08/08/2013  . Hypertension   . Migraine   . Pelvic pain in female 12/25/2014  . Vaginal Pap smear, abnormal     SURGICAL HISTORY: Past Surgical History:  Procedure Laterality Date  . BREAST BIOPSY Right   . CESAREAN SECTION      SOCIAL HISTORY: Social History   Social History  . Marital status: Single    Spouse name: N/A  . Number of children: N/A  . Years of education: N/A   Occupational History  . Not on file.   Social History Main Topics  . Smoking status: Never  Smoker  . Smokeless tobacco: Never Used  . Alcohol use No  . Drug use: No  . Sexual activity: Yes    Birth control/ protection: Post-menopausal   Other Topics Concern  . Not on file   Social History Narrative  . No narrative on file    FAMILY HISTORY: Family History  Problem Relation Age of Onset  . COPD Mother   . Cancer Mother     uterus,lung  . Diabetes Mother   . Hypertension Mother   . Heart disease Mother   . Early death Father   . Kidney disease Father   . Hypertension Father   . Hypertension Sister   . Hypertension Brother   . Cancer Maternal Aunt   . Stroke Maternal Grandfather   . Stroke Paternal Grandmother   . Stroke Paternal Grandfather   . Hypertension Sister   . Hypertension Sister   . Early death Sister   . Heart failure Sister   . Kidney disease Maternal Aunt   . Heart disease Maternal Aunt     ALLERGIES:  is allergic to diphenhydramine; hctz [hydrochlorothiazide]; and lisinopril.  MEDICATIONS:  Current Outpatient Prescriptions  Medication Sig Dispense Refill  . albuterol (PROVENTIL HFA;VENTOLIN HFA) 108 (90 BASE) MCG/ACT inhaler Inhale 2 puffs into the lungs every 4 (four) hours as needed for wheezing or shortness of breath. 6.7 g 0  . amLODipine (NORVASC) 10 MG tablet Take 10 mg by mouth daily.    Marland Kitchen aspirin 81 MG tablet Take 81 mg by mouth daily.    Marland Kitchen  BIOTIN PO Take 1 tablet by mouth daily.    . cetirizine (ZYRTEC) 10 MG tablet Take 10 mg by mouth daily.    . clobetasol cream (TEMOVATE) [id number] % Apply 1 application topically 2 (two) times daily.    Marland Kitchen Cod Liver Oil CAPS Take 1 capsule by mouth daily.    . Coenzyme Q10 (COQ10) 100 MG CAPS Take 1 tablet by mouth daily.    . cyclobenzaprine (FLEXERIL) 10 MG tablet TK 1 T PO ONCE DAILY  0  . famotidine (PEPCID) 20 MG tablet Take 20 mg by mouth 2 (two) times daily.    . fluticasone (FLONASE) 50 MCG/ACT nasal spray Place 2 sprays into both nostrils daily.    . furosemide (LASIX) 40 MG tablet Take 40  mg by mouth daily.     . Multiple Vitamin (MULTIVITAMIN) tablet Take 1 tablet by mouth daily.    Marland Kitchen oseltamivir (TAMIFLU) 75 MG capsule Take 1 capsule (75 mg total) by mouth 2 (two) times daily. 10 capsule 0  . pravastatin (PRAVACHOL) 20 MG tablet Take 20 mg by mouth daily.    . promethazine (PHENERGAN) 25 MG tablet Take 1 tablet (25 mg total) by mouth every 6 (six) hours as needed for nausea or vomiting. 30 tablet 1  . spironolactone (ALDACTONE) 50 MG tablet Take 50 mg by mouth daily.    . valACYclovir (VALTREX) 500 MG tablet TAKE ONE TABLET BY MOUTH TWICE DAILY 10 tablet 0   No current facility-administered medications for this visit.     REVIEW OF SYSTEMS:   Constitutional: Denies fevers, chills or abnormal night sweats Eyes: Denies blurriness of vision, double vision or watery eyes Ears, nose, mouth, throat, and face: Denies mucositis or sore throat Respiratory: Denies cough, dyspnea or wheezes Cardiovascular: Denies palpitation, chest discomfort or lower extremity swelling Gastrointestinal:  Denies nausea, heartburn or change in bowel habits Skin: Rash on bilateral neck Lymphatics: Denies new lymphadenopathy or easy bruising Neurological:Denies numbness, tingling or new weaknesses Behavioral/Psych: Mood is stable, no new changes  Breast:  Denies any palpable lumps or discharge All other systems were reviewed with the patient and are negative.  PHYSICAL EXAMINATION: ECOG PERFORMANCE STATUS: 0 - Asymptomatic  Vitals:   01/07/16 1543  BP: 120/86  Pulse: 74  Resp: 20  Temp: 97.8 F (36.6 C)   Filed Weights   01/07/16 1543  Weight: 228 lb 4.8 oz (103.6 kg)    GENERAL:alert, no distress and comfortable SKIN: Skin darkening bilateral neck EYES: normal, conjunctiva are pink and non-injected, sclera clear OROPHARYNX:no exudate, no erythema and lips, buccal mucosa, and tongue normal  NECK: supple, thyroid normal size, non-tender, without nodularity LYMPH:  no palpable  lymphadenopathy in the cervical, axillary or inguinal LUNGS: clear to auscultation and percussion with normal breathing effort HEART: regular rate & rhythm and no murmurs and no lower extremity edema ABDOMEN:abdomen soft, non-tender and normal bowel sounds Musculoskeletal:no cyanosis of digits and no clubbing  PSYCH: alert & oriented x 3 with fluent speech NEURO: no focal motor/sensory deficits BREAST: No palpable nodules in breast. No palpable axillary or supraclavicular lymphadenopathy (exam performed in the presence of a chaperone)   LABORATORY DATA:  I have reviewed the data as listed Lab Results  Component Value Date   WBC 8.2 04/03/2010   HGB 13.6 04/03/2010   HCT 40.0 04/03/2010   MCV 76.7 (L) 04/03/2010   PLT 231 04/03/2010   Lab Results  Component Value Date   NA 138 04/03/2010   K 3.3 (  L) 04/03/2010   CL 104 04/03/2010   ASSESSMENT AND PLAN:  Neoplasm of right breast, primary tumor staging category Tis: lobular carcinoma in situ (LCIS) Right breast biopsy 12/03/2015 upper outer quadrant: Pleomorphic lobular carcinoma in situ with comedonecrosis and calcifications, grade 2-3  Pathology counseling: I discussed with her the difference between LCIS and invasive cancer. LCIS is a risk factor for breast cancer and not a premalignant condition. She is at higher than normal risk of breast cancer. Risk of invasive and noninvasive breast cancer with LCIS is 1% per year. Her cumulative risk lifetime would be around 30%. Tamoxifen would reduce this risk by half. This is based on NSABP P-1 clinical trial  Risk reduction strategies: 1. Tamoxifen or raloxifene are indicated to decrease the risk of another noninvasive or invasive breast cancer. Patient fully understands that neither of these medications would not prolong her life but certainly help decrease recurrence rate by 50% . 2. Recommended annual mammograms and breast exams.  3. Nonpharmacological measures including exercise weight  loss reducing red meat intake  Tamoxifen toxicities:We discussed the risks and benefits of tamoxifen. These include but not limited to insomnia, hot flashes, mood changes, vaginal dryness, and weight gain. Although rare, serious side effects including endometrial cancer, risk of blood clots were also discussed. We strongly believe that the benefits far outweigh the risks. Patient understands these risks and consented to starting treatment. Planned treatment duration is 5 years.  Return to clinic after surgery to discuss the final pathology report.   All questions were answered. The patient knows to call the clinic with any problems, questions or concerns.    Rulon Eisenmenger, MD 01/07/16

## 2016-01-07 NOTE — Telephone Encounter (Signed)
appt made and avs printed °

## 2016-01-07 NOTE — Assessment & Plan Note (Signed)
Right breast biopsy 12/03/2015 upper outer quadrant: Pleomorphic lobular carcinoma in situ with comedonecrosis and calcifications, grade 2-3  Pathology counseling: I discussed with her the difference between LCIS and invasive cancer. LCIS is a risk factor for breast cancer and not a premalignant condition. She is at higher than normal risk of breast cancer. Risk of invasive and noninvasive breast cancer with LCIS is 1% per year. Her cumulative risk lifetime would be around 30%. Tamoxifen would reduce this risk by half. This is based on NSABP P-1 clinical trial  Risk reduction strategies: 1. Tamoxifen or raloxifene are indicated to decrease the risk of another noninvasive or invasive breast cancer. Patient fully understands that neither of these medications would prolong her life but certainly help decrease recurrence rate by 50% . 2. Recommended annual mammograms and breast exams.   Tamoxifen toxicities:We discussed the risks and benefits of tamoxifen. These include but not limited to insomnia, hot flashes, mood changes, vaginal dryness, and weight gain. Although rare, serious side effects including endometrial cancer, risk of blood clots were also discussed. We strongly believe that the benefits far outweigh the risks. Patient understands these risks and consented to starting treatment. Planned treatment duration is 5 years.

## 2016-01-07 NOTE — Telephone Encounter (Signed)
error 

## 2016-01-08 NOTE — Addendum Note (Signed)
Addended by: Cheree Ditto on: 01/08/2016 11:13 AM   Modules accepted: Orders

## 2016-01-09 ENCOUNTER — Encounter (HOSPITAL_BASED_OUTPATIENT_CLINIC_OR_DEPARTMENT_OTHER): Payer: Self-pay | Admitting: *Deleted

## 2016-01-09 NOTE — Progress Notes (Signed)
Plans to go to [facility] today or tomorrow for BMET and EKG. Bring all medications. To pick up Boost drink Tuesday when she comes for seed.

## 2016-01-13 ENCOUNTER — Other Ambulatory Visit: Payer: Self-pay

## 2016-01-13 ENCOUNTER — Encounter (HOSPITAL_COMMUNITY)
Admission: RE | Admit: 2016-01-13 | Discharge: 2016-01-13 | Disposition: A | Discharge status: Home / Self Care | Payer: BLUE CROSS/BLUE SHIELD | Source: Ambulatory Visit | Attending: Surgery | Admitting: Surgery

## 2016-01-13 DIAGNOSIS — Z8249 Family history of ischemic heart disease and other diseases of the circulatory system: Secondary | ICD-10-CM | POA: Diagnosis not present

## 2016-01-13 DIAGNOSIS — Z6833 Body mass index (BMI) 33.0-33.9, adult: Secondary | ICD-10-CM | POA: Diagnosis not present

## 2016-01-13 DIAGNOSIS — I10 Essential (primary) hypertension: Secondary | ICD-10-CM | POA: Diagnosis not present

## 2016-01-13 DIAGNOSIS — C50911 Malignant neoplasm of unspecified site of right female breast: Secondary | ICD-10-CM | POA: Diagnosis present

## 2016-01-13 LAB — BASIC METABOLIC PANEL
Anion gap: 7 (ref 5–15)
BUN: 16 mg/dL (ref 6–20)
CHLORIDE: 105 mmol/L (ref 101–111)
CO2: 26 mmol/L (ref 22–32)
CREATININE: 0.59 mg/dL (ref 0.44–1.00)
Calcium: 8.7 mg/dL — ABNORMAL LOW (ref 8.9–10.3)
Glucose, Bld: 83 mg/dL (ref 65–99)
POTASSIUM: 3.7 mmol/L (ref 3.5–5.1)
SODIUM: 138 mmol/L (ref 135–145)

## 2016-01-14 ENCOUNTER — Ambulatory Visit
Admission: RE | Admit: 2016-01-14 | Discharge: 2016-01-14 | Disposition: A | Discharge status: Home / Self Care | Payer: BLUE CROSS/BLUE SHIELD | Source: Ambulatory Visit | Attending: Surgery | Admitting: Surgery

## 2016-01-14 DIAGNOSIS — D0501 Lobular carcinoma in situ of right breast: Secondary | ICD-10-CM

## 2016-01-14 NOTE — Progress Notes (Signed)
Pt given 8 oz of boost breeze with verbal and written instructions (teach back) to drink at 1100 am morning of surgery and no other liquid after midnight.  Pt verbalized understanding

## 2016-01-15 ENCOUNTER — Ambulatory Visit (HOSPITAL_BASED_OUTPATIENT_CLINIC_OR_DEPARTMENT_OTHER): Payer: BLUE CROSS/BLUE SHIELD | Admitting: Certified Registered"

## 2016-01-15 ENCOUNTER — Encounter (HOSPITAL_BASED_OUTPATIENT_CLINIC_OR_DEPARTMENT_OTHER)
Admission: RE | Disposition: A | Discharge status: Home / Self Care | Payer: Self-pay | Source: Ambulatory Visit | Attending: Surgery

## 2016-01-15 ENCOUNTER — Encounter (HOSPITAL_BASED_OUTPATIENT_CLINIC_OR_DEPARTMENT_OTHER): Payer: Self-pay | Admitting: Certified Registered"

## 2016-01-15 ENCOUNTER — Ambulatory Visit (HOSPITAL_BASED_OUTPATIENT_CLINIC_OR_DEPARTMENT_OTHER)
Admission: RE | Admit: 2016-01-15 | Discharge: 2016-01-15 | Disposition: A | Discharge status: Home / Self Care | Payer: BLUE CROSS/BLUE SHIELD | Source: Ambulatory Visit | Attending: Surgery | Admitting: Surgery

## 2016-01-15 ENCOUNTER — Ambulatory Visit
Admission: RE | Admit: 2016-01-15 | Discharge: 2016-01-15 | Disposition: A | Discharge status: Home / Self Care | Payer: BLUE CROSS/BLUE SHIELD | Source: Ambulatory Visit | Attending: Surgery | Admitting: Surgery

## 2016-01-15 DIAGNOSIS — C50911 Malignant neoplasm of unspecified site of right female breast: Secondary | ICD-10-CM | POA: Diagnosis not present

## 2016-01-15 DIAGNOSIS — Z8249 Family history of ischemic heart disease and other diseases of the circulatory system: Secondary | ICD-10-CM | POA: Insufficient documentation

## 2016-01-15 DIAGNOSIS — Z6833 Body mass index (BMI) 33.0-33.9, adult: Secondary | ICD-10-CM | POA: Insufficient documentation

## 2016-01-15 DIAGNOSIS — D0501 Lobular carcinoma in situ of right breast: Secondary | ICD-10-CM

## 2016-01-15 DIAGNOSIS — I10 Essential (primary) hypertension: Secondary | ICD-10-CM | POA: Insufficient documentation

## 2016-01-15 HISTORY — PX: BREAST LUMPECTOMY WITH RADIOACTIVE SEED LOCALIZATION: SHX6424

## 2016-01-15 SURGERY — BREAST LUMPECTOMY WITH RADIOACTIVE SEED LOCALIZATION
Anesthesia: General | Site: Breast | Laterality: Left

## 2016-01-15 MED ORDER — LACTATED RINGERS IV SOLN
INTRAVENOUS | Status: DC
  Administered 2016-01-15 (×2): via INTRAVENOUS

## 2016-01-15 MED ORDER — MIDAZOLAM HCL 2 MG/2ML IJ SOLN
INTRAMUSCULAR | Status: AC
  Filled 2016-01-15: qty 2

## 2016-01-15 MED ORDER — FENTANYL CITRATE (PF) 100 MCG/2ML IJ SOLN
50.0000 ug | INTRAMUSCULAR | Status: DC | PRN
  Administered 2016-01-15: 100 ug via INTRAVENOUS
  Administered 2016-01-15: 50 ug via INTRAVENOUS

## 2016-01-15 MED ORDER — BUPIVACAINE-EPINEPHRINE (PF) 0.25% -1:200000 IJ SOLN
INTRAMUSCULAR | Status: AC
  Filled 2016-01-15: qty 60

## 2016-01-15 MED ORDER — FENTANYL CITRATE (PF) 100 MCG/2ML IJ SOLN
INTRAMUSCULAR | Status: AC
  Filled 2016-01-15: qty 2

## 2016-01-15 MED ORDER — GABAPENTIN 300 MG PO CAPS
300.0000 mg | ORAL_CAPSULE | ORAL | Status: DC
Start: 2016-01-16 — End: 2016-01-15

## 2016-01-15 MED ORDER — SCOPOLAMINE 1 MG/3DAYS TD PT72: 1.0000 | MEDICATED_PATCH | Freq: Once | TRANSDERMAL | Status: DC | PRN

## 2016-01-15 MED ORDER — GLYCOPYRROLATE 0.2 MG/ML IJ SOLN: 0.2000 mg | Freq: Once | INTRAMUSCULAR | Status: DC | PRN

## 2016-01-15 MED ORDER — CEFAZOLIN SODIUM-DEXTROSE 2-4 GM/100ML-% IV SOLN
2.0000 g | INTRAVENOUS | Status: AC
  Administered 2016-01-15: 2 g via INTRAVENOUS

## 2016-01-15 MED ORDER — ONDANSETRON HCL 4 MG/2ML IJ SOLN
INTRAMUSCULAR | Status: AC
  Filled 2016-01-15: qty 2

## 2016-01-15 MED ORDER — BUPIVACAINE HCL (PF) 0.25 % IJ SOLN
INTRAMUSCULAR | Status: AC
  Filled 2016-01-15: qty 30

## 2016-01-15 MED ORDER — MORPHINE SULFATE (PF) 2 MG/ML IV SOLN: 2.0000 mg | INTRAVENOUS | Status: DC | PRN

## 2016-01-15 MED ORDER — ACETAMINOPHEN 500 MG PO TABS: 1000.0000 mg | ORAL_TABLET | ORAL | Status: DC

## 2016-01-15 MED ORDER — CHLORHEXIDINE GLUCONATE CLOTH 2 % EX PADS: 6.0000 | MEDICATED_PAD | Freq: Once | CUTANEOUS | Status: DC

## 2016-01-15 MED ORDER — BUPIVACAINE HCL (PF) 0.5 % IJ SOLN
INTRAMUSCULAR | Status: AC
  Filled 2016-01-15: qty 30

## 2016-01-15 MED ORDER — BUPIVACAINE-EPINEPHRINE 0.25% -1:200000 IJ SOLN
INTRAMUSCULAR | Status: DC | PRN
  Administered 2016-01-15: 10 mL

## 2016-01-15 MED ORDER — DEXAMETHASONE SODIUM PHOSPHATE 4 MG/ML IJ SOLN
INTRAMUSCULAR | Status: DC | PRN
  Administered 2016-01-15: 10 mg via INTRAVENOUS

## 2016-01-15 MED ORDER — HYDROMORPHONE HCL 1 MG/ML IJ SOLN
0.2500 mg | INTRAMUSCULAR | Status: DC | PRN
Start: 2016-01-15 — End: 2016-01-15

## 2016-01-15 MED ORDER — ONDANSETRON HCL 4 MG/2ML IJ SOLN
INTRAMUSCULAR | Status: DC | PRN
  Administered 2016-01-15: 4 mg via INTRAVENOUS

## 2016-01-15 MED ORDER — HYDROCODONE-ACETAMINOPHEN 5-325 MG PO TABS: 1.0000 | ORAL_TABLET | ORAL | 0 refills | Status: DC | PRN

## 2016-01-15 MED ORDER — PROPOFOL 10 MG/ML IV BOLUS
INTRAVENOUS | Status: DC | PRN
  Administered 2016-01-15: 200 mg via INTRAVENOUS

## 2016-01-15 MED ORDER — CEFAZOLIN SODIUM-DEXTROSE 2-4 GM/100ML-% IV SOLN
INTRAVENOUS | Status: AC
  Filled 2016-01-15: qty 100

## 2016-01-15 MED ORDER — OXYCODONE HCL 5 MG PO TABS
5.0000 mg | ORAL_TABLET | Freq: Once | ORAL | Status: AC | PRN
  Administered 2016-01-15: 5 mg via ORAL

## 2016-01-15 MED ORDER — MEPERIDINE HCL 25 MG/ML IJ SOLN: 6.2500 mg | INTRAMUSCULAR | Status: DC | PRN

## 2016-01-15 MED ORDER — MIDAZOLAM HCL 2 MG/2ML IJ SOLN
1.0000 mg | INTRAMUSCULAR | Status: DC | PRN
  Administered 2016-01-15: 2 mg via INTRAVENOUS

## 2016-01-15 MED ORDER — HYDROCODONE-ACETAMINOPHEN 5-325 MG PO TABS: 1.0000 | ORAL_TABLET | ORAL | Status: DC | PRN

## 2016-01-15 MED ORDER — ONDANSETRON HCL 4 MG/2ML IJ SOLN: 4.0000 mg | Freq: Once | INTRAMUSCULAR | Status: DC | PRN

## 2016-01-15 MED ORDER — LIDOCAINE HCL (CARDIAC) 20 MG/ML IV SOLN
INTRAVENOUS | Status: DC | PRN
  Administered 2016-01-15: 100 mg via INTRAVENOUS

## 2016-01-15 MED ORDER — OXYCODONE HCL 5 MG/5ML PO SOLN: 5.0000 mg | Freq: Once | ORAL | Status: AC | PRN

## 2016-01-15 MED ORDER — ONDANSETRON HCL 4 MG/2ML IJ SOLN: 4.0000 mg | INTRAMUSCULAR | Status: DC | PRN

## 2016-01-15 MED ORDER — DEXAMETHASONE SODIUM PHOSPHATE 10 MG/ML IJ SOLN
INTRAMUSCULAR | Status: AC
  Filled 2016-01-15: qty 1

## 2016-01-15 MED ORDER — OXYCODONE HCL 5 MG PO TABS
ORAL_TABLET | ORAL | Status: AC
  Filled 2016-01-15: qty 1

## 2016-01-15 MED ORDER — EPINEPHRINE HCL 1 MG/ML IJ SOLN
INTRAMUSCULAR | Status: AC
  Filled 2016-01-15: qty 1

## 2016-01-15 SURGICAL SUPPLY — 53 items
APL SKNCLS STERI-STRIP NONHPOA (GAUZE/BANDAGES/DRESSINGS) ×1
APPLIER CLIP 9.375 MED OPEN (MISCELLANEOUS) ×2
APR CLP MED 9.3 20 MLT OPN (MISCELLANEOUS) ×1
BENZOIN TINCTURE PRP APPL 2/3 (GAUZE/BANDAGES/DRESSINGS) ×2 IMPLANT
BLADE HEX COATED 2.75 (ELECTRODE) ×2 IMPLANT
BLADE SURG 15 STRL LF DISP TIS (BLADE) ×1 IMPLANT
BLADE SURG 15 STRL SS (BLADE) ×2
CANISTER SUC SOCK COL 7IN (MISCELLANEOUS) IMPLANT
CANISTER SUCT 1200ML W/VALVE (MISCELLANEOUS) ×2 IMPLANT
CHLORAPREP W/TINT 26ML (MISCELLANEOUS) ×2 IMPLANT
CLIP APPLIE 9.375 MED OPEN (MISCELLANEOUS) ×1 IMPLANT
COVER BACK TABLE 60X90IN (DRAPES) ×2 IMPLANT
COVER MAYO STAND STRL (DRAPES) ×2 IMPLANT
COVER PROBE W GEL 5X96 (DRAPES) ×2 IMPLANT
DECANTER SPIKE VIAL GLASS SM (MISCELLANEOUS) IMPLANT
DEVICE DUBIN W/COMP PLATE 8390 (MISCELLANEOUS) ×2 IMPLANT
DRAPE LAPAROTOMY 100X72 PEDS (DRAPES) ×2 IMPLANT
DRAPE UTILITY XL STRL (DRAPES) ×2 IMPLANT
DRSG TEGADERM 4X4.75 (GAUZE/BANDAGES/DRESSINGS) ×2 IMPLANT
ELECT REM PT RETURN 9FT ADLT (ELECTROSURGICAL) ×2
ELECTRODE REM PT RTRN 9FT ADLT (ELECTROSURGICAL) ×1 IMPLANT
GLOVE BIO SURGEON STRL SZ7 (GLOVE) ×2 IMPLANT
GLOVE BIOGEL PI IND STRL 7.0 (GLOVE) IMPLANT
GLOVE BIOGEL PI IND STRL 7.5 (GLOVE) ×1 IMPLANT
GLOVE BIOGEL PI IND STRL 8 (GLOVE) IMPLANT
GLOVE BIOGEL PI INDICATOR 7.0 (GLOVE) ×1
GLOVE BIOGEL PI INDICATOR 7.5 (GLOVE) ×1
GLOVE BIOGEL PI INDICATOR 8 (GLOVE) ×1
GLOVE ECLIPSE 6.5 STRL STRAW (GLOVE) ×1 IMPLANT
GOWN STRL REUS W/ TWL LRG LVL3 (GOWN DISPOSABLE) ×2 IMPLANT
GOWN STRL REUS W/TWL LRG LVL3 (GOWN DISPOSABLE) ×4
KIT MARKER MARGIN INK (KITS) ×2 IMPLANT
NDL HYPO 25X1 1.5 SAFETY (NEEDLE) ×1 IMPLANT
NEEDLE HYPO 25X1 1.5 SAFETY (NEEDLE) ×2 IMPLANT
NS IRRIG 1000ML POUR BTL (IV SOLUTION) ×2 IMPLANT
PACK BASIN DAY SURGERY FS (CUSTOM PROCEDURE TRAY) ×2 IMPLANT
PENCIL BUTTON HOLSTER BLD 10FT (ELECTRODE) ×2 IMPLANT
SLEEVE SCD COMPRESS KNEE MED (MISCELLANEOUS) ×2 IMPLANT
SPONGE GAUZE 2X2 8PLY STRL LF (GAUZE/BANDAGES/DRESSINGS) IMPLANT
SPONGE GAUZE 4X4 12PLY STER LF (GAUZE/BANDAGES/DRESSINGS) IMPLANT
SPONGE LAP 18X18 X RAY DECT (DISPOSABLE) IMPLANT
SPONGE LAP 4X18 X RAY DECT (DISPOSABLE) ×2 IMPLANT
STRIP CLOSURE SKIN 1/2X4 (GAUZE/BANDAGES/DRESSINGS) ×2 IMPLANT
SUT MON AB 4-0 PC3 18 (SUTURE) ×2 IMPLANT
SUT SILK 2 0 SH (SUTURE) IMPLANT
SUT VIC AB 3-0 SH 27 (SUTURE) ×2
SUT VIC AB 3-0 SH 27X BRD (SUTURE) ×1 IMPLANT
SYR BULB 3OZ (MISCELLANEOUS) ×1 IMPLANT
SYR CONTROL 10ML LL (SYRINGE) ×2 IMPLANT
TOWEL OR 17X24 6PK STRL BLUE (TOWEL DISPOSABLE) ×2 IMPLANT
TOWEL OR NON WOVEN STRL DISP B (DISPOSABLE) ×2 IMPLANT
TUBE CONNECTING 20X1/4 (TUBING) ×3 IMPLANT
YANKAUER SUCT BULB TIP NO VENT (SUCTIONS) ×3 IMPLANT

## 2016-01-15 NOTE — Op Note (Signed)
Preop diagnosis:  Lobular carcinoma in situ  right breast. Postop diagnosis: Same Procedure performed: Right radioactive seed localized lumpectomy Surgeon:Vail Vuncannon K. Anesthesia: Gen. via LMA Indications: This is a 55 year old female who was recently found to have a 3 mm area of abnormal microcalcifications in the upper-outer right breast. Stereotactic biopsy showed pleomorphic lobular carcinoma in situ. She presents now for lumpectomy. A radioactive seed was placed yesterday by radiology. The presence of the seed was confirmed in the preoperative holding area.  Description of procedure: Patient is brought to the operating room and placed in a supine position on the operating room table. After an adequate level of general anesthesia was obtained, we prepped her right breast with ChloraPrep and draped in sterile fashion. A timeout was taken to ensure the proper patient and proper procedure. We infiltrated the area around the area below with 0.25% Marcaine with epinephrine. We interrogated the breast with the neoprobe and the seed seems to be in the right upper outer quadrant about 3 cm lateral to the edge of the areola. We made a curvilinear incision on the right side of the areole. We dissected laterally until we were anterior to the radioactive seed. We then raised 4 margins around the radioactive seed. We grasped the specimen with an Allis clamp and elevated it. We then dissected posteriorly and amputated the specimen.  As we were dissecting posteriorly, we encountered some significant bleeding. I was unable to visualize the bleeding vessel. We broke protocol and activated the suction. We able to identify the small bleeding vein and cauterized it with electrocautery. We then turned to suction. The seed was still within the specimen when interrogated by the neoprobe. We finished amputating the specimen. We oriented specimen with a paint kit.  Specimen mammogram showed that the seed was within the center  of the specimen. A biopsy clip is also within the specimen. These were sent for pathologic examination. We irrigated the wound thoroughly and inspected for hemostasis. The wound was closed with 3-0 Vicryl and 4-0 Monocryl. Benzoin Steri-Strips were used to seal the incision. An occlusive dressing was placed. The patient was then extubated and brought to the recovery room in stable condition. All sponge, instrument, and needle counts are correct.  Imogene Burn. Georgette Dover, MD, [facility] Surgery  General/ Trauma Surgery  01/15/2016 2:51 PM

## 2016-01-15 NOTE — Transfer of Care (Signed)
Immediate Anesthesia Transfer of Care Note  Patient: Catherine Hansen  Procedure(s) Performed: Procedure(s): BREAST LUMPECTOMY WITH RADIOACTIVE SEED LOCALIZATION (Left)  Patient Location: PACU  Anesthesia Type:General  Level of Consciousness: awake, sedated and responds to stimulation  Airway & Oxygen Therapy: Patient Spontanous Breathing and Patient connected to face mask oxygen  Post-op Assessment: Report given to RN, Post -op Vital signs reviewed and stable and Patient moving all extremities  Post vital signs: Reviewed and stable  Last Vitals:  Vitals:   01/15/16 1204  BP: 134/69  Pulse: 93  Resp: 20  Temp: 36.7 C    Last Pain:  Vitals:   01/15/16 1204  TempSrc: Oral         Complications: No apparent anesthesia complications

## 2016-01-15 NOTE — Anesthesia Procedure Notes (Signed)
Procedure Name: LMA Insertion Date/Time: 01/15/2016 1:44 PM Performed by: Baxter Flattery Pre-anesthesia Checklist: Patient identified, Emergency Drugs available, Suction available and Patient being monitored Patient Re-evaluated:Patient Re-evaluated prior to inductionOxygen Delivery Method: Circle system utilized Preoxygenation: Pre-oxygenation with 100% oxygen Intubation Type: IV induction Ventilation: Mask ventilation without difficulty LMA: LMA inserted LMA Size: 4.0 Number of attempts: 1 Airway Equipment and Method: Bite block Placement Confirmation: positive ETCO2 and breath sounds checked- equal and bilateral Tube secured with: Tape Dental Injury: Teeth and Oropharynx as per pre-operative assessment

## 2016-01-15 NOTE — Anesthesia Preprocedure Evaluation (Signed)
Anesthesia Evaluation  Patient identified by MRN, date of birth, ID band Patient awake    Reviewed: Allergy & Precautions, NPO status , Patient's Chart, lab work & pertinent test results  Airway Mallampati: I  TM Distance: >3 FB Neck ROM: Full    Dental  (+) Teeth Intact, Dental Advisory Given   Pulmonary asthma (No problems in 3 years, no inhaler) ,    breath sounds clear to auscultation       Cardiovascular hypertension,  Rhythm:Regular Rate:Normal     Neuro/Psych    GI/Hepatic   Endo/Other  Morbid obesity  Renal/GU      Musculoskeletal   Abdominal   Peds  Hematology   Anesthesia Other Findings   Reproductive/Obstetrics                             Anesthesia Physical Anesthesia Plan  ASA: II  Anesthesia Plan: General   Post-op Pain Management:    Induction: Intravenous  Airway Management Planned: LMA  Additional Equipment:   Intra-op Plan:   Post-operative Plan: Extubation in OR  Informed Consent: I have reviewed the patients History and Physical, chart, labs and discussed the procedure including the risks, benefits and alternatives for the proposed anesthesia with the patient or authorized representative who has indicated his/her understanding and acceptance.   Dental advisory given  Plan Discussed with: CRNA, Anesthesiologist and Surgeon  Anesthesia Plan Comments:         Anesthesia Quick Evaluation

## 2016-01-15 NOTE — H&P (View-Only) (Signed)
History of Present Illness Catherine Hansen. Anel Creighton MD; 12/20/2015 12:24 PM) The patient is a 55 year old female who presents with a complaint of Breast problems. Referred by Dr. Curlene Dolphin for LCIS right breast PCP - Derrek Monaco  This is a 55 year old female who recently underwent short-term follow-up mammogram for left breast asymmetry. Her mammogram was performed on 11/26/15. She had a new finding of a 3 mm area of calcifications in the upper outer right breast. She has persistent asymmetry in the left breast that is unchanged. She underwent stereotactic biopsy on 12/03/15 with placement of a clip. 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 She presents now for surgical evaluation.  Menarche - age 61 First pregnancy - age 74 Breastfeed - no Hormones - OCP 19 years Menopause - age 57 Previous core biopsy right breast [place] - benign FH - no breast cancer; mother - ovarian cancer at young age  CLINICAL DATA: Patient for short-term follow-up probably benign left breast asymmetry.  EXAM: 2D DIGITAL DIAGNOSTIC BILATERAL MAMMOGRAM WITH CAD AND ADJUNCT TOMO  ULTRASOUND LEFT BREAST  COMPARISON: Previous exam(s).  ACR Breast Density Category b: There are scattered areas of fibroglandular density.  FINDINGS: Magnification CC and true lateral views of the left breast demonstrate a new 3 mm group of round calcifications within the upper-outer right breast middle depth. Persistent asymmetry within the medial left breast middle depth. No additional concerning masses or areas of architectural distortion identified within either breast.  Mammographic images were processed with CAD.  On physical exam, I palpate no discrete mass within the medial left breast.  Targeted ultrasound is performed, showing no definite sonographic correlate for focal asymmetry medial left breast.  IMPRESSION: New group of rounded  calcifications within the upper-outer right breast, indeterminate.  Stable but probably benign focal asymmetry left breast.  RECOMMENDATION: Stereotactic guided core needle biopsy right breast calcifications.  One additional follow-up mammogram of left breast asymmetry in 12 months.  I have discussed the findings and recommendations with the patient. Results were also provided in writing at the conclusion of the visit. If applicable, a reminder letter will be sent to the patient regarding the next appointment.  BI-RADS CATEGORY 4: Suspicious.   Electronically Signed By: Lovey Newcomer M.D. On: 11/26/2015 15:05  CLINICAL DATA: Calcifications right breast for which stereotactic biopsy was recommended.  EXAM: RIGHT BREAST STEREOTACTIC CORE NEEDLE BIOPSY  COMPARISON: Previous exams.  FINDINGS: The patient and I discussed the procedure of stereotactic-guided biopsy including benefits and alternatives. We discussed the high likelihood of a successful procedure. We discussed the risks of the procedure including infection, bleeding, tissue injury, clip migration, and inadequate sampling. Informed written consent was given. The usual time out protocol was performed immediately prior to the procedure.  Using sterile technique and 1% Lidocaine as local anesthetic, under stereotactic guidance, a 9 gauge vacuum assisted device was used to perform core needle biopsy of calcifications in the upper-outer quadrant of the right breast using a superior to inferior approach. Specimen radiograph was performed showing multiple calcifications. Specimens with calcifications are identified for pathology.  At the conclusion of the procedure, a tissue marker clip was deployed into the biopsy cavity. Follow-up 2-view mammogram was performed and dictated separately.  IMPRESSION: Stereotactic-guided biopsy of the right breast. No apparent complications.  Electronically Signed: By: Curlene Dolphin M.D. On: 12/03/2015 11:32  ADDENDUM REPORT: 12/09/2015 15:08 ADDENDUM: Pathology revealed pleomorphic lobular carcinoma in situ with comedonecrosis and calcifications in  the right breast. This was found to be concordant by Dr. Curlene Dolphin. Pathology results were discussed with the patient by telephone. The patient reported doing well after the biopsy. Post biopsy instructions and care were reviewed and questions were answered. The patient was encouraged to call [facility] for any additional concerns. The patient requested surgical consultation in [place] and has been scheduled with Dr. Donnie Mesa at [facility] on December 20, 2015. Pathology results reported by Susa Raring RN, BSN on 12/09/2015. Electronically Signed By: Curlene Dolphin M.D. On: 12/09/2015 15:08   Other Problems (Alisha Spillers, CMA; 12/20/2015 11:24 AM) Anxiety Disorder Arthritis Asthma Back Pain Bladder Problems Cancer Chest pain Gastroesophageal Reflux Disease Heart murmur Hemorrhoids High blood pressure Hypercholesterolemia Migraine Headache  Past Surgical History Lars Mage Spillers, CMA; 12/20/2015 11:24 AM) Cesarean Section - Multiple  Diagnostic Studies History Lars Mage Spillers, CMA; 12/20/2015 11:24 AM) Colonoscopy never Mammogram within last year Pap Smear 1-5 years ago  Allergies Lars Mage Spillers, CMA; 12/20/2015 10:54 AM) Benadryl *ANTIHISTAMINES* Lisinopril *ANTIHYPERTENSIVES* HydroCHLOROthiazide *DIURETICS*  Medication History (Alisha Spillers, CMA; 12/20/2015 10:52 AM) AmLODIPine Besylate (10MG  Tablet, Oral) Active. Cetirizine HCl (10MG  Tablet, Oral) Active. Fluticasone Propionate (50MCG/ACT Suspension, Nasal) Active. Furosemide (40MG  Tablet, Oral) Active. Naproxen (500MG  Tablet, Oral) Active. Spironolactone (50MG  Tablet, Oral) Active. Pravastatin Sodium (20MG  Tablet, Oral) Active. Promethazine  HCl (25MG  Tablet, Oral) Active. Baby Aspirin (81MG  Tablet Chewable, Oral) Active. Medications Reconciled  Social History Illene Regulus, CMA; 12/20/2015 11:24 AM) Caffeine use Carbonated beverages, Coffee, Tea. No alcohol use No drug use Tobacco use Never smoker.  Family History Illene Regulus, [place]; 12/20/2015 11:24 AM) Arthritis Mother. Breast Cancer Mother. Cancer Mother. Depression Daughter. Diabetes Mellitus Mother. Heart Disease Mother. Hypertension Brother, Father, Mother, Sister. Kidney Disease Father. Migraine Headache Daughter, Mother. Ovarian Cancer Mother. Respiratory Condition Daughter, Mother.  Pregnancy / Birth History Illene Regulus, CMA; 12/20/2015 11:24 AM) Age at menarche 54 years. Age of menopause 51-55 Contraceptive History Intrauterine device, Oral contraceptives. Gravida 3 Maternal age 38-20 Para 3     Review of Systems Lars Mage Spillers CMA; 12/20/2015 11:24 AM) General Present- Fatigue and Night Sweats. Not Present- Appetite Loss, Chills, Fever, Weight Gain and Weight Loss. Skin Present- Change in Wart/Mole and New Lesions. Not Present- Dryness, Hives, Jaundice, Non-Healing Wounds, Rash and Ulcer. HEENT Present- Seasonal Allergies, Sinus Pain and Wears glasses/contact lenses. Not Present- Earache, Hearing Loss, Hoarseness, Nose Bleed, Oral Ulcers, Ringing in the Ears, Sore Throat, Visual Disturbances and Yellow Eyes. Respiratory Present- Snoring. Not Present- Bloody sputum, Chronic Cough, Difficulty Breathing and Wheezing. Breast Present- Breast Mass. Not Present- Breast Pain, Nipple Discharge and Skin Changes. Cardiovascular Present- Difficulty Breathing Lying Down and Leg Cramps. Not Present- Chest Pain, Palpitations, Rapid Heart Rate, Shortness of Breath and Swelling of Extremities. Gastrointestinal Present- Bloating and Indigestion. Not Present- Abdominal Pain, Bloody Stool, Change in Bowel Habits, Chronic diarrhea,  Constipation, Difficulty Swallowing, Excessive gas, Gets full quickly at meals, Hemorrhoids, Nausea, Rectal Pain and Vomiting. Female Genitourinary Present- Frequency and Nocturia. Not Present- Painful Urination, Pelvic Pain and Urgency. Musculoskeletal Present- Joint Pain, Joint Stiffness, Muscle Pain and Swelling of Extremities. Not Present- Back Pain and Muscle Weakness. Neurological Present- Headaches and Tingling. Not Present- Decreased Memory, Fainting, Numbness, Seizures, Tremor, Trouble walking and Weakness. Psychiatric Present- Change in Sleep Pattern. Not Present- Anxiety, Bipolar, Depression, Fearful and Frequent crying. Endocrine Present- Hair Changes and Hot flashes. Not Present- Cold Intolerance, Excessive Hunger, Heat Intolerance and New Diabetes. Hematology Present- Blood Thinners and Easy Bruising.  Not Present- Excessive bleeding, Gland problems, HIV and Persistent Infections.  Vitals (Alisha Spillers CMA; 12/20/2015 10:49 AM) 12/20/2015 10:48 AM Weight: 227.6 lb Height: 68.5in Body Surface Area: 2.17 m Body Mass Index: 34.1 kg/m  Pulse: 58 (Regular)  BP: 122/80 (Sitting, Left Arm, Standard)      Physical Exam Rodman Key K. Jazzlynn Rawe MD; 12/20/2015 12:25 PM)  The physical exam findings are as follows: Note:WDWN in NAD HEENT: EOMI, sclera anicteric Neck: No masses, no thyromegaly Lungs: CTA bilaterally; normal respiratory effort Breast: Symmetric, no palpable lymphadenopathy on either side; no dominant masses in either breasts; no nipple retraction or discharge; healed biopsy site right upper outer quadrant CV: Regular rate and rhythm; no murmurs Abd: +bowel sounds, soft, non-tender, no masses Ext: Well-perfused; no edema Skin: Warm, dry; no sign of jaundice    Assessment & Plan Rodman Key K. Vance Hochmuth MD; 12/20/2015 12:26 PM)  LOBULAR CARCINOMA IN SITU (LCIS) OF RIGHT BREAST (D05.01) Impression: 3 mm right upper outer quadrant  Current Plans Schedule for Surgery -  Right radioactive seed localized lumpectomy. The surgical procedure has been discussed with the patient. Potential risks, benefits, alternative treatments, and expected outcomes have been explained. All of the patient's questions at this time have been answered. The likelihood of reaching the patient's treatment goal is good. The patient understand the proposed surgical procedure and wishes to proceed. Pt Education - [facility] Breast Cancer Information Given - [employer] "Breast Journey" Package  Catherine Hansen. Georgette Dover, MD, [facility]  General/ Trauma Surgery  12/20/2015 12:27 PM

## 2016-01-15 NOTE — Anesthesia Postprocedure Evaluation (Signed)
Anesthesia Post Note  Patient: Catherine Hansen  Procedure(s) Performed: Procedure(s) (LRB): BREAST LUMPECTOMY WITH RADIOACTIVE SEED LOCALIZATION (Left)  Patient location during evaluation: PACU Anesthesia Type: General Level of consciousness: awake and alert Pain management: pain level controlled Vital Signs Assessment: post-procedure vital signs reviewed and stable Respiratory status: spontaneous breathing, nonlabored ventilation and respiratory function stable Cardiovascular status: blood pressure returned to baseline and stable Postop Assessment: no signs of nausea or vomiting Anesthetic complications: no    Last Vitals:  Vitals:   01/15/16 1604 01/15/16 1612  BP: (!) 152/99 (!) 145/71  Pulse: 75   Resp: 18   Temp: 36.7 C     Last Pain:  Vitals:   01/15/16 1604  TempSrc:   PainSc: 3                  Chelcea Zahn A

## 2016-01-15 NOTE — Discharge Instructions (Signed)
[facility] °Office Phone Number [phone] ° °BREAST BIOPSY/ PARTIAL MASTECTOMY: POST OP INSTRUCTIONS ° °Always review your discharge instruction sheet given to you by the facility where your surgery was performed. ° °IF YOU HAVE DISABILITY OR FAMILY LEAVE FORMS, YOU MUST BRING THEM TO THE OFFICE FOR PROCESSING.  DO NOT GIVE THEM TO YOUR DOCTOR. ° °1. A prescription for pain medication may be given to you upon discharge.  Take your pain medication as prescribed, if needed.  If narcotic pain medicine is not needed, then you may take acetaminophen (Tylenol) or ibuprofen (Advil) as needed. °2. Take your usually prescribed medications unless otherwise directed °3. If you need a refill on your pain medication, please contact your pharmacy.  They will contact our office to request authorization.  Prescriptions will not be filled after 5pm or on week-ends. °4. You should eat very light the first 24 hours after surgery, such as soup, crackers, pudding, etc.  Resume your normal diet the day after surgery. °5. Most patients will experience some swelling and bruising in the breast.  Ice packs and a good support bra will help.  Swelling and bruising can take several days to resolve.  °6. It is common to experience some constipation if taking pain medication after surgery.  Increasing fluid intake and taking a stool softener will usually help or prevent this problem from occurring.  A mild laxative (Milk of Magnesia or Miralax) should be taken according to package directions if there are no bowel movements after 48 hours. °7. Unless discharge instructions indicate otherwise, you may remove your bandages 48 hours after surgery, and you may shower at that time.  You will have steri-strips (small skin tapes) in place directly over the incision.  These strips should be left on the skin for 7-10 days.   Any sutures or staples will be removed at the office during your follow-up visit. °8. ACTIVITIES:  You may resume  regular daily activities (gradually increasing) beginning the next day.  Wearing a good support bra or sports bra minimizes pain and swelling.  You may have sexual intercourse when it is comfortable. °a. You may drive when you no longer are taking prescription pain medication, you can comfortably wear a seatbelt, and you can safely maneuver your car and apply brakes. °b. RETURN TO WORK:  1-2 weeks °9. You should see your doctor in the office for a follow-up appointment approximately two weeks after your surgery.  Your doctor’s nurse will typically make your follow-up appointment when she calls you with your pathology report.  Expect your pathology report 2-3 business days after your surgery.  You may call to check if you do not hear from us after three days. °10. OTHER INSTRUCTIONS: _______________________________________________________________________________________________ _____________________________________________________________________________________________________________________________________ °_____________________________________________________________________________________________________________________________________ °_____________________________________________________________________________________________________________________________________ ° °WHEN TO CALL YOUR DOCTOR: °1. Fever over 101.0 °2. Nausea and/or vomiting. °3. Extreme swelling or bruising. °4. Continued bleeding from incision. °5. Increased pain, redness, or drainage from the incision. ° °The clinic staff is available to answer your questions during regular business hours.  Please don’t hesitate to call and ask to speak to one of the nurses for clinical concerns.  If you have a medical emergency, go to the nearest emergency room or call 911.  A surgeon from [facility] is always on call at the hospital. ° °For further questions, please visit [email or web address]  ° ° ° °Post Anesthesia Home Care  Instructions ° °Activity: °Get plenty of rest for the remainder of the day. A responsible adult should stay   with you for 24 hours following the procedure.  °For the next 24 hours, DO NOT: °-Drive a car °-Operate machinery °-Drink alcoholic beverages °-Take any medication unless instructed by your physician °-Make any legal decisions or sign important papers. ° °Meals: °Start with liquid foods such as gelatin or soup. Progress to regular foods as tolerated. Avoid greasy, spicy, heavy foods. If nausea and/or vomiting occur, drink only clear liquids until the nausea and/or vomiting subsides. Call your physician if vomiting continues. ° °Special Instructions/Symptoms: °Your throat may feel dry or sore from the anesthesia or the breathing tube placed in your throat during surgery. If this causes discomfort, gargle with warm salt water. The discomfort should disappear within 24 hours. ° °If you had a scopolamine patch placed behind your ear for the management of post- operative nausea and/or vomiting: ° °1. The medication in the patch is effective for 72 hours, after which it should be removed.  Wrap patch in a tissue and discard in the trash. Wash hands thoroughly with soap and water. °2. You may remove the patch earlier than 72 hours if you experience unpleasant side effects which may include dry mouth, dizziness or visual disturbances. °3. Avoid touching the patch. Wash your hands with soap and water after contact with the patch. °  ° °

## 2016-01-15 NOTE — Interval H&P Note (Signed)
History and Physical Interval Note:  01/15/2016 12:20 PM  Catherine Hansen  has presented today for surgery, with the diagnosis of LCIS RIGHT BREAST  The various methods of treatment have been discussed with the patient and family. After consideration of risks, benefits and other options for treatment, the patient has consented to  Procedure(s): BREAST LUMPECTOMY WITH RADIOACTIVE SEED LOCALIZATION (Left) as a surgical intervention .  The patient's history has been reviewed, patient examined, no change in status, stable for surgery.  I have reviewed the patient's chart and labs.  Questions were answered to the patient's satisfaction.     Rocquel Askren K.

## 2016-01-16 ENCOUNTER — Encounter (HOSPITAL_BASED_OUTPATIENT_CLINIC_OR_DEPARTMENT_OTHER): Payer: Self-pay | Admitting: Surgery

## 2016-01-22 ENCOUNTER — Ambulatory Visit (HOSPITAL_BASED_OUTPATIENT_CLINIC_OR_DEPARTMENT_OTHER): Payer: BLUE CROSS/BLUE SHIELD | Admitting: Hematology and Oncology

## 2016-01-22 ENCOUNTER — Telehealth: Payer: Self-pay | Admitting: Hematology and Oncology

## 2016-01-22 ENCOUNTER — Telehealth: Payer: Self-pay | Admitting: *Deleted

## 2016-01-22 ENCOUNTER — Encounter: Payer: Self-pay | Admitting: Hematology and Oncology

## 2016-01-22 DIAGNOSIS — D0501 Lobular carcinoma in situ of right breast: Secondary | ICD-10-CM | POA: Diagnosis not present

## 2016-01-22 MED ORDER — TAMOXIFEN CITRATE 20 MG PO TABS: 20.0000 mg | ORAL_TABLET | Freq: Every day | ORAL | 3 refills | Status: DC

## 2016-01-22 NOTE — Telephone Encounter (Signed)
appt made and avs printed °

## 2016-01-22 NOTE — Progress Notes (Signed)
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 She had done very well from the surgery. Has very mild discomfort in the breast.  REVIEW OF SYSTEMS:   Constitutional: Denies fevers, chills or abnormal weight loss Eyes: Denies blurriness of vision Ears, nose, mouth, throat, and face: Denies mucositis or sore throat Respiratory: Denies cough, dyspnea or wheezes Cardiovascular: Denies palpitation, chest discomfort Gastrointestinal:  Denies nausea, heartburn or change in bowel habits Skin: Denies abnormal skin rashes Lymphatics: Denies new lymphadenopathy or easy bruising Neurological:Denies numbness, tingling or new weaknesses Behavioral/Psych: Mood is stable, no new changes  Extremities: No lower extremity edema Breast:  Recent lumpectomy All other systems were reviewed with the patient and are negative.  I have reviewed the past medical history, past surgical history, social history and family history with the patient and they are unchanged from previous note.  ALLERGIES:  is allergic to diphenhydramine; hctz [hydrochlorothiazide]; lisinopril; other; and tuberculin tests.  MEDICATIONS:  Current Outpatient Prescriptions  Medication Sig Dispense Refill  . amLODipine (NORVASC) 10 MG tablet Take 10 mg by mouth daily.    . Ascorbic Acid (VITAMIN C) 1000 MG tablet Take 1,000 mg by  mouth daily.    Marland Kitchen aspirin 81 MG tablet Take 81 mg by mouth daily.    . B Complex Vitamins (VITAMIN B COMPLEX PO) Take 1,000 mg by mouth.    . cetirizine (ZYRTEC) 10 MG tablet Take 10 mg by mouth daily.    . COD LIVER OIL PO Take by mouth.    . Coenzyme Q10 (COQ-10 PO) Take by mouth.    . fluticasone (FLONASE) 50 MCG/ACT nasal spray Place 2 sprays into both nostrils daily.    . furosemide (LASIX) 40 MG tablet Take 40 mg by mouth daily.     Marland Kitchen HYDROcodone-acetaminophen (NORCO/VICODIN) 5-325 MG tablet Take 1 tablet by mouth every 4 (four) hours as needed. 20 tablet 0  . naproxen (NAPROSYN) 500 MG tablet TK 1 T PO QD  1  . pravastatin (PRAVACHOL) 20 MG tablet Take 20 mg by mouth daily.    . promethazine (PHENERGAN) 25 MG tablet Take 1 tablet (25 mg total) by mouth every 6 (six) hours as needed for nausea or vomiting. 30 tablet 1  . spironolactone (ALDACTONE) 50 MG tablet Take 50 mg by mouth daily.    . tamoxifen (NOLVADEX) 20 MG tablet Take 1 tablet (20 mg total) by mouth daily. 90 tablet 3   No current facility-administered medications for this visit.     PHYSICAL EXAMINATION: ECOG PERFORMANCE STATUS: 1 - Symptomatic but completely ambulatory  Vitals:   01/22/16 1444  BP: 130/71  Pulse: 94  Resp: 18  Temp: 97.7 F (36.5 C)   Filed Weights   01/22/16 1444  Weight: 227 lb 14.4 oz (103.4 kg)    GENERAL:alert, no distress and comfortable SKIN: skin color, texture, turgor are normal, no rashes or significant  lesions EYES: normal, Conjunctiva are pink and non-injected, sclera clear OROPHARYNX:no exudate, no erythema and lips, buccal mucosa, and tongue normal  NECK: supple, thyroid normal size, non-tender, without nodularity LYMPH:  no palpable lymphadenopathy in the cervical, axillary or inguinal LUNGS: clear to auscultation and percussion with normal breathing effort HEART: regular rate & rhythm and no murmurs and no lower extremity edema ABDOMEN:abdomen soft, non-tender and normal  bowel sounds MUSCULOSKELETAL:no cyanosis of digits and no clubbing  NEURO: alert & oriented x 3 with fluent speech, no focal motor/sensory deficits EXTREMITIES: No lower extremity edema  LABORATORY DATA:  I have reviewed the data as listed   Chemistry      Component Value Date/Time   NA 138 01/13/2016 0920   K 3.7 01/13/2016 0920   CL 105 01/13/2016 0920   CO2 26 01/13/2016 0920   BUN 16 01/13/2016 0920   CREATININE 0.59 01/13/2016 0920      Component Value Date/Time   CALCIUM 8.7 (L) 01/13/2016 0920       Lab Results  Component Value Date   WBC 8.2 04/03/2010   HGB 13.6 04/03/2010   HCT 40.0 04/03/2010   MCV 76.7 (L) 04/03/2010   PLT 231 04/03/2010   NEUTROABS 4.6 04/03/2010     ASSESSMENT & PLAN:  Neoplasm of right breast, primary tumor staging category Tis: lobular carcinoma in situ (LCIS) Right breast biopsy 12/03/2015 upper outer quadrant: 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 Tamoxifen 20 mg daily . 2. Recommended annual mammograms and breast exams.  3. Nonpharmacological measures including exercise, weight loss, reducing red meat intake  Return to clinic in 3 months to assess tolerability to tamoxifen therapy.   No orders of the defined types were placed in this encounter.  The patient has a good understanding of the overall plan. she agrees with it. she will call with any problems that may develop before the next visit here.   Rulon Eisenmenger, MD 01/22/16

## 2016-01-22 NOTE — Assessment & Plan Note (Signed)
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 Tamoxifen 20 mg daily . 2. Recommended annual mammograms and breast exams.  3. Nonpharmacological measures including exercise, weight loss, reducing red meat intake  Return to clinic in 3 months to assess tolerability to tamoxifen therapy.

## 2016-01-22 NOTE — Telephone Encounter (Signed)
Call from patient in reference to her 2:15 appointment.  "I'm on my way and will be there in about 5 to 6 minutes.  Called collaborative to notify her patient is on her way in."

## 2016-01-29 ENCOUNTER — Other Ambulatory Visit: Payer: Self-pay | Admitting: Women's Health

## 2016-01-30 ENCOUNTER — Telehealth: Payer: Self-pay | Admitting: Adult Health

## 2016-01-30 NOTE — Telephone Encounter (Signed)
Spoke with pt. Pt is requesting refills on Valtrex, enough to last until Jan, when she is seen. Please advise. Thanks!! [place]

## 2016-01-31 MED ORDER — VALACYCLOVIR HCL 1 G PO TABS: 1000.0000 mg | ORAL_TABLET | Freq: Every day | ORAL | 12 refills | Status: DC

## 2016-01-31 NOTE — Telephone Encounter (Signed)
Refilled valtrex  

## 2016-04-14 ENCOUNTER — Telehealth: Payer: Self-pay

## 2016-04-14 NOTE — Telephone Encounter (Signed)
Pt called with complaints of nausea x 2 weeks and  some bone pain in the last week. Called pt today and pt reported that she had some sinus issues and cough that required use of antibiotics. Pt started having n/v shortly after antibiotic course. Pt states that she is still eating well, but will occasionally regurgitate food back up. Pt also complained of bone pain and aching around lower extremities. Pt reports no fever, chills, sob, cp, or any new onset of infection at this time. Pt states that she has not taken any pain medication for pain. Advised pt to take some OTC medication for antacids to help with reflux of food, and encouraged pt to eat smaller meals throughout the day. Told pt to avoid foods with caffeine, fried and red sauces. Also, pt advised to take her naproxen to help with bone pain. Pt verbalized understanding and will call if symptoms persist. Notified pt of upcoming appt on 04/28/16 with Dr. Lindi Adie.

## 2016-04-26 NOTE — Assessment & Plan Note (Signed)
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 Tamoxifen 20 mg daily . 2. Recommended annual mammograms and breast exams.  3. Nonpharmacological measures including exercise, weight loss, reducing red meat intake  Tamoxifen Toxicities:  Return to clinic in 6 months.

## 2016-04-28 ENCOUNTER — Ambulatory Visit (HOSPITAL_BASED_OUTPATIENT_CLINIC_OR_DEPARTMENT_OTHER): Payer: BLUE CROSS/BLUE SHIELD | Admitting: Hematology and Oncology

## 2016-04-28 ENCOUNTER — Encounter: Payer: Self-pay | Admitting: Hematology and Oncology

## 2016-04-28 DIAGNOSIS — D0501 Lobular carcinoma in situ of right breast: Secondary | ICD-10-CM | POA: Diagnosis not present

## 2016-04-28 DIAGNOSIS — N951 Menopausal and female climacteric states: Secondary | ICD-10-CM

## 2016-04-28 NOTE — Progress Notes (Signed)
Patient Care Team: Elin Claggett, PA-C as PCP - General (Family Medicine)  DIAGNOSIS:  Encounter Diagnosis  Name Primary?  . 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 She is tolerating tamoxifen fairly well. She does have hot flashes which are significant and are bothering her. Denies any myalgias or arthralgias.  REVIEW OF SYSTEMS:   Constitutional: Denies fevers, chills or abnormal weight loss Eyes: Denies blurriness of vision Ears, nose, mouth, throat, and face: Denies mucositis or sore throat Respiratory: Denies cough, dyspnea or wheezes Cardiovascular: Denies palpitation, chest discomfort Gastrointestinal:  Denies nausea, heartburn or change in bowel habits Skin: Denies abnormal skin rashes Lymphatics: Denies new lymphadenopathy or easy bruising Neurological:Denies numbness, tingling or new weaknesses Behavioral/Psych: Mood is stable, no new changes  Extremities: No lower extremity edema Breast:  denies any pain or lumps or nodules in either breasts All other systems were reviewed with the patient and are negative.  I have reviewed the past medical history, past surgical history, social history and family history with the patient and they are unchanged from previous  note.  ALLERGIES:  is allergic to diphenhydramine; hctz [hydrochlorothiazide]; lisinopril; other; and tuberculin tests.  MEDICATIONS:  Current Outpatient Prescriptions  Medication Sig Dispense Refill  . amLODipine (NORVASC) 10 MG tablet Take 10 mg by mouth daily.    . Ascorbic Acid (VITAMIN C) 1000 MG tablet Take 1,000 mg by mouth daily.    Marland Kitchen aspirin 81 MG tablet Take 81 mg by mouth daily.    . B Complex Vitamins (VITAMIN B COMPLEX PO) Take 1,000 mg by mouth.    . cetirizine (ZYRTEC) 10 MG tablet Take 10 mg by mouth daily.    . COD LIVER OIL PO Take by mouth.    . Coenzyme Q10 (COQ-10 PO) Take by mouth.    . fluticasone (FLONASE) 50 MCG/ACT nasal spray Place 2 sprays into both nostrils daily.    . furosemide (LASIX) 40 MG tablet Take 40 mg by mouth daily.     Marland Kitchen HYDROcodone-acetaminophen (NORCO/VICODIN) 5-325 MG tablet Take 1 tablet by mouth every 4 (four) hours as needed. 20 tablet 0  . naproxen (NAPROSYN) 500 MG tablet TK 1 T PO QD  1  . pravastatin (PRAVACHOL) 20 MG tablet Take 20 mg by mouth daily.    . promethazine (PHENERGAN) 25 MG tablet Take 1 tablet (25 mg total) by mouth every 6 (six) hours as needed for nausea or vomiting. 30 tablet 1  . spironolactone (ALDACTONE) 50 MG tablet Take 50 mg by mouth daily.    . tamoxifen (NOLVADEX) 20 MG tablet Take 1 tablet (20 mg total) by mouth daily. 90 tablet 3  . valACYclovir (VALTREX) 1000 MG tablet Take 1 tablet (1,000 mg total) by mouth daily. 30 tablet 12  No current facility-administered medications for this visit.     PHYSICAL EXAMINATION: ECOG PERFORMANCE STATUS: 1 - Symptomatic but completely ambulatory  Vitals:   04/28/16 1124  BP: 135/80  Pulse: 85  Resp: 18  Temp: 98.1 F (36.7 C)   Filed Weights   04/28/16 1124  Weight: 235 lb 9 oz (106.9 kg)    GENERAL:alert, no distress and comfortable SKIN: skin color, texture, turgor are normal, no rashes or significant lesions EYES: normal, Conjunctiva are pink and  non-injected, sclera clear OROPHARYNX:no exudate, no erythema and lips, buccal mucosa, and tongue normal  NECK: supple, thyroid normal size, non-tender, without nodularity LYMPH:  no palpable lymphadenopathy in the cervical, axillary or inguinal LUNGS: clear to auscultation and percussion with normal breathing effort HEART: regular rate & rhythm and no murmurs and no lower extremity edema ABDOMEN:abdomen soft, non-tender and normal bowel sounds MUSCULOSKELETAL:no cyanosis of digits and no clubbing  NEURO: alert & oriented x 3 with fluent speech, no focal motor/sensory deficits EXTREMITIES: No lower extremity edema  LABORATORY DATA:  I have reviewed the data as listed   Chemistry      Component Value Date/Time   NA 138 01/13/2016 0920   K 3.7 01/13/2016 0920   CL 105 01/13/2016 0920   CO2 26 01/13/2016 0920   BUN 16 01/13/2016 0920   CREATININE 0.59 01/13/2016 0920      Component Value Date/Time   CALCIUM 8.7 (L) 01/13/2016 0920       Lab Results  Component Value Date   WBC 8.2 04/03/2010   HGB 13.6 04/03/2010   HCT 40.0 04/03/2010   MCV 76.7 (L) 04/03/2010   PLT 231 04/03/2010   NEUTROABS 4.6 04/03/2010     ASSESSMENT & PLAN:  Neoplasm of right breast, primary tumor staging category Tis: lobular carcinoma in situ (LCIS) Right breast biopsy 12/03/2015 upper outer quadrant: 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 Tamoxifen 20 mg daily Started September 2017 . 2. Recommended annual mammograms and breast exams.  3. Nonpharmacological measures including exercise, weight loss, reducing red meat intake  Tamoxifen Toxicities: 1. Severe hot flashes: I discussed with her about taking the medication at different time in the day. We also discussed pharmacologic measures to decrease hot flashes. But she's not interested in that at this  time.  Return to clinic in 6 months.   No orders of the defined types were placed in this encounter.  The patient has a good understanding of the overall plan. she agrees with it. she will call with any problems that may develop before the next visit here.   Rulon Eisenmenger, MD 04/28/16

## 2016-08-27 ENCOUNTER — Telehealth: Payer: Self-pay

## 2016-08-27 ENCOUNTER — Telehealth: Payer: Self-pay | Admitting: *Deleted

## 2016-08-27 ENCOUNTER — Telehealth: Payer: Self-pay | Admitting: Emergency Medicine

## 2016-08-27 NOTE — Telephone Encounter (Signed)
Voicemail received from patient requesting a call back. Message sent to Dr Geralyn Flash nurse.

## 2016-08-27 NOTE — Telephone Encounter (Signed)
error 

## 2016-08-27 NOTE — Telephone Encounter (Signed)
"  I've been feeling bad for a couple of days.  I've been on tamoxifen maybe seven months now.  I'm weak, kind of nauseated and can feel two soft knots on both shoulders.  The areas are soft like when something is swollen.  Feels like a nagging, paining pull. I can't sleep good.  I haven't taken anything for nausea.  Will have to get a refill.  I drink lots of water.  Last BM was last night with some constipation.  BM looked like logs.  I plan to get a stool softener."  Return number [phone].  If nausea related to constipation this nurse discussed ways to help with bowels like drinking more water, hot beverages and walking.  Denies any activity to affect shoulders.  Will notify provider of these symptoms.

## 2016-08-27 NOTE — Telephone Encounter (Signed)
Returned pt vm from triage. Pt states that she has been experiencing some fatigue and soft tender knot on bilateral shoulder. Pt states that she had been doing a lot of physical activity and cleaning in the past few days and have been having this soft knot. Pt denies fever, chills, sob,cp, numbness/tingling. Pt states that she had been on tamoxifen for almost a yr. Pt eating/drinking okay. Advised pt that it could be muscle tension from physical work and cleaning. Advised pt to try warm heat pack and gentle massage of shoulder. Told pt to take tylenol or ibuprofen (unless contraindicated) to help with inflammation/pain. If symptoms doesn't go away in the next 2 days, pt will need to contact her primary care dr. Pt verbalized understanding and wanted to call and make sure that it doesn't have to do with her cancer. Assured pt that she is ok and can call our office if she has any more questions/concerns.

## 2016-09-15 ENCOUNTER — Encounter (INDEPENDENT_AMBULATORY_CARE_PROVIDER_SITE_OTHER): Payer: Self-pay

## 2016-09-15 ENCOUNTER — Encounter: Payer: Self-pay | Admitting: *Deleted

## 2016-09-15 ENCOUNTER — Encounter: Payer: Self-pay | Admitting: Adult Health

## 2016-09-15 ENCOUNTER — Ambulatory Visit (INDEPENDENT_AMBULATORY_CARE_PROVIDER_SITE_OTHER): Payer: BLUE CROSS/BLUE SHIELD | Admitting: Adult Health

## 2016-09-15 VITALS — BP 120/78 | HR 64 | Ht 67.75 in | Wt 232.0 lb

## 2016-09-15 DIAGNOSIS — Z1211 Encounter for screening for malignant neoplasm of colon: Secondary | ICD-10-CM

## 2016-09-15 DIAGNOSIS — Z853 Personal history of malignant neoplasm of breast: Secondary | ICD-10-CM | POA: Diagnosis not present

## 2016-09-15 DIAGNOSIS — Z1212 Encounter for screening for malignant neoplasm of rectum: Secondary | ICD-10-CM | POA: Diagnosis not present

## 2016-09-15 DIAGNOSIS — Z01419 Encounter for gynecological examination (general) (routine) without abnormal findings: Secondary | ICD-10-CM | POA: Diagnosis not present

## 2016-09-15 LAB — HEMOCCULT GUIAC POC 1CARD (OFFICE): Fecal Occult Blood, POC: NEGATIVE

## 2016-09-15 MED ORDER — PROMETHAZINE HCL 25 MG PO TABS: 25.0000 mg | ORAL_TABLET | Freq: Four times a day (QID) | ORAL | 1 refills | Status: DC | PRN

## 2016-09-15 MED ORDER — VALACYCLOVIR HCL 1 G PO TABS: 1000.0000 mg | ORAL_TABLET | Freq: Every day | ORAL | 12 refills | Status: DC

## 2016-09-15 NOTE — Patient Instructions (Addendum)
Physical in 1 year Pap 2020 Mammogram yearly Colonoscopy advised, will refer to [facility],Dr [facility]

## 2016-09-15 NOTE — Progress Notes (Signed)
Patient ID: Catherine Hansen, female   DOB: 1961/01/12, 56 y.o.   MRN: [id number] History of Present Illness: Malin is a 56 year old black female for well woman gyn exam, she had pap with negative HPV 08/27/15. PCP is [facility].    Current Medications, Allergies, Past Medical History, Past Surgical History, Family History and Social History were reviewed in [employer] record.     Review of Systems: Patient denies any headaches, hearing loss, fatigue, blurred vision, shortness of breath, chest pain, abdominal pain, problems with bowel movements, urination, or intercourse. No joint pain or mood swings.    Physical Exam:BP 120/78 (BP Location: Left Arm, Patient Position: Sitting, Cuff Size: Large)   Pulse 64   Ht 5' 7.75" (1.721 m)   Wt 232 lb (105.2 kg)   LMP 02/08/2013 (Exact Date)   BMI 35.54 kg/m  General:  Well developed, well nourished, no acute distress Skin:  Warm and dry Neck:  Midline trachea, normal thyroid, good ROM, no lymphadenopathy Lungs; Clear to auscultation bilaterally Breast:  No dominant palpable mass, retraction, or nipple discharge Cardiovascular: Regular rate and rhythm Abdomen:  Soft, non tender, no hepatosplenomegaly Pelvic:  External genitalia is normal in appearance, no lesions.  The vagina is normal in appearance. Urethra has no lesions or masses. The cervix is bulbous and smooth.  Uterus is felt to be normal size, shape, and contour.  No adnexal masses or tenderness noted.Bladder is non tender, no masses felt. Rectal: Good sphincter tone, no polyps, or hemorrhoids felt.  Hemoccult negative. Extremities/musculoskeletal:  No swelling or varicosities noted, no clubbing or cyanosis Psych:  No mood changes, alert and cooperative,seems happy PHQ 2 score 0.She requested refills on valtrex and phenergan.   Impression: 1. Well woman exam with routine gynecological exam   2. Screening for colorectal cancer   3. History of breast cancer        Plan: Meds ordered this encounter  Medications  . promethazine (PHENERGAN) 25 MG tablet    Sig: Take 1 tablet (25 mg total) by mouth every 6 (six) hours as needed for nausea or vomiting.    Dispense:  30 tablet    Refill:  1    Order Specific Question:   Supervising Provider    Answer:   Elonda Husky, LUTHER H [[id number]]  . valACYclovir (VALTREX) 1000 MG tablet    Sig: Take 1 tablet (1,000 mg total) by mouth daily.    Dispense:  30 tablet    Refill:  12    Order Specific Question:   Supervising Provider    Answer:   Florian Buff [[id number]]  Referred to Dr Oneida Alar for screening colonoscopy Labs at PCP Physical in 1 year Pap in 2020 Mammogram yearly  Rx given to get bra

## 2016-09-21 ENCOUNTER — Telehealth: Payer: Self-pay | Admitting: Gastroenterology

## 2016-09-21 NOTE — Telephone Encounter (Signed)
Pt received triage letter from DS. Please call [phone]

## 2016-09-23 ENCOUNTER — Telehealth: Payer: Self-pay

## 2016-09-23 NOTE — Telephone Encounter (Signed)
See separate triage.  

## 2016-09-24 ENCOUNTER — Other Ambulatory Visit: Payer: Self-pay

## 2016-09-24 DIAGNOSIS — Z1211 Encounter for screening for malignant neoplasm of colon: Secondary | ICD-10-CM

## 2016-09-24 NOTE — Telephone Encounter (Signed)
Gastroenterology Pre-Procedure Review  Request Date: 09/23/2016 Requesting Physician: Derrek Monaco, NP  PATIENT REVIEW QUESTIONS: The patient responded to the following health history questions as indicated:    1. Diabetes Melitis: no 2. Joint replacements in the past 12 months: no 3. Major health problems in the past 3 months: no 4. Has an artificial valve or MVP: HAS HEART MURMUR 5. Has a defibrillator: no 6. Has been advised in past to take antibiotics in advance of a procedure like teeth cleaning: YES 7. Family history of colon cancer: no  8. Alcohol Use: no 9. History of sleep apnea: no  10. History of coronary artery or other vascular stents placed within the last 12 months: no    MEDICATIONS & ALLERGIES:    Patient reports the following regarding taking any blood thinners:   Plavix? no Aspirin? no Coumadin? no Brilinta? no Xarelto? no Eliquis? no Pradaxa? no Savaysa? no Effient? no  Patient confirms/reports the following medications:  Current Outpatient Prescriptions  Medication Sig Dispense Refill  . amLODipine (NORVASC) 10 MG tablet Take 10 mg by mouth daily.    . Ascorbic Acid (VITAMIN C) 1000 MG tablet Take 1,000 mg by mouth daily.    Marland Kitchen aspirin 81 MG tablet Take 81 mg by mouth daily.    . B Complex Vitamins (VITAMIN B COMPLEX PO) Take 1,000 mg by mouth.    . cetirizine (ZYRTEC) 10 MG tablet Take 10 mg by mouth daily.    . COD LIVER OIL PO Take by mouth.    . Coenzyme Q10 (COQ-10 PO) Take by mouth.    . fluticasone (FLONASE) 50 MCG/ACT nasal spray Place 2 sprays into both nostrils daily.    . furosemide (LASIX) 40 MG tablet Take 40 mg by mouth daily.     Marland Kitchen HYDROcodone-acetaminophen (NORCO/VICODIN) 5-325 MG tablet Take 1 tablet by mouth every 4 (four) hours as needed. 20 tablet 0  . naproxen (NAPROSYN) 500 MG tablet TK 1 T PO QD  1  . pravastatin (PRAVACHOL) 20 MG tablet Take 20 mg by mouth daily.    . promethazine (PHENERGAN) 25 MG tablet Take 1 tablet (25 mg  total) by mouth every 6 (six) hours as needed for nausea or vomiting. 30 tablet 1  . spironolactone (ALDACTONE) 50 MG tablet Take 50 mg by mouth daily.    . tamoxifen (NOLVADEX) 20 MG tablet Take 1 tablet (20 mg total) by mouth daily. 90 tablet 3  . valACYclovir (VALTREX) 1000 MG tablet Take 1 tablet (1,000 mg total) by mouth daily. 30 tablet 12   No current facility-administered medications for this visit.     Patient confirms/reports the following allergies:  Allergies  Allergen Reactions  . Diphenhydramine Swelling    Swelling of face of tongue  . Hctz [Hydrochlorothiazide] Swelling    Swelling of face and tongue  . Lisinopril Swelling    Swelling of face and tongue  . Other Hives    Skin so soft deoderant  . Tuberculin Tests Hives    No orders of the defined types were placed in this encounter.   AUTHORIZATION INFORMATION Primary Insurance:  ID #:  Group #:  Pre-Cert / Auth required:  Pre-Cert / Auth #:   Secondary Insurance:   ID #:   Group #:  Pre-Cert / Auth required: Pre-Cert / Auth #:   SCHEDULE INFORMATION: Procedure has been scheduled as follows:  Date:    10/26/2016             Time:  12:00 NOON Location: [facility] Short Stay  This Gastroenterology Pre-Precedure Review Form is being routed to the following provider(s): Barney Drain, MD

## 2016-09-28 NOTE — Telephone Encounter (Signed)
REVIEWED. NO NEED FOR ABX PROPHYLAXIS.  MOVI PREP SPLIT DOSING, REGULAR BREAKFAST. CLEAR LIQUIDS AFTER 9 AM.

## 2016-10-02 MED ORDER — PEG 3350-KCL-NA BICARB-NACL 420 G PO SOLR: 4000.0000 mL | ORAL | 0 refills | Status: DC

## 2016-10-02 NOTE — Telephone Encounter (Signed)
Rx sent to the pharmacy and instructions mailed to pt.  

## 2016-10-08 NOTE — Telephone Encounter (Signed)
NO PA is needed for TCS 

## 2016-10-26 ENCOUNTER — Encounter (HOSPITAL_COMMUNITY)
Admission: RE | Disposition: A | Discharge status: Home / Self Care | Payer: Self-pay | Source: Ambulatory Visit | Attending: Gastroenterology

## 2016-10-26 ENCOUNTER — Ambulatory Visit (HOSPITAL_COMMUNITY)
Admission: RE | Admit: 2016-10-26 | Discharge: 2016-10-26 | Disposition: A | Discharge status: Home / Self Care | Payer: BLUE CROSS/BLUE SHIELD | Source: Ambulatory Visit | Attending: Gastroenterology | Admitting: Gastroenterology

## 2016-10-26 ENCOUNTER — Encounter (HOSPITAL_COMMUNITY): Payer: Self-pay

## 2016-10-26 DIAGNOSIS — K5790 Diverticulosis of intestine, part unspecified, without perforation or abscess without bleeding: Secondary | ICD-10-CM | POA: Diagnosis not present

## 2016-10-26 DIAGNOSIS — Z79899 Other long term (current) drug therapy: Secondary | ICD-10-CM | POA: Diagnosis not present

## 2016-10-26 DIAGNOSIS — Z841 Family history of disorders of kidney and ureter: Secondary | ICD-10-CM | POA: Diagnosis not present

## 2016-10-26 DIAGNOSIS — Z8049 Family history of malignant neoplasm of other genital organs: Secondary | ICD-10-CM | POA: Insufficient documentation

## 2016-10-26 DIAGNOSIS — Z7951 Long term (current) use of inhaled steroids: Secondary | ICD-10-CM | POA: Diagnosis not present

## 2016-10-26 DIAGNOSIS — B009 Herpesviral infection, unspecified: Secondary | ICD-10-CM | POA: Insufficient documentation

## 2016-10-26 DIAGNOSIS — I10 Essential (primary) hypertension: Secondary | ICD-10-CM | POA: Insufficient documentation

## 2016-10-26 DIAGNOSIS — Z888 Allergy status to other drugs, medicaments and biological substances status: Secondary | ICD-10-CM | POA: Insufficient documentation

## 2016-10-26 DIAGNOSIS — Z8249 Family history of ischemic heart disease and other diseases of the circulatory system: Secondary | ICD-10-CM | POA: Diagnosis not present

## 2016-10-26 DIAGNOSIS — Z801 Family history of malignant neoplasm of trachea, bronchus and lung: Secondary | ICD-10-CM | POA: Diagnosis not present

## 2016-10-26 DIAGNOSIS — K648 Other hemorrhoids: Secondary | ICD-10-CM | POA: Insufficient documentation

## 2016-10-26 DIAGNOSIS — Z7982 Long term (current) use of aspirin: Secondary | ICD-10-CM | POA: Insufficient documentation

## 2016-10-26 DIAGNOSIS — Z887 Allergy status to serum and vaccine status: Secondary | ICD-10-CM | POA: Diagnosis not present

## 2016-10-26 DIAGNOSIS — Z853 Personal history of malignant neoplasm of breast: Secondary | ICD-10-CM | POA: Diagnosis not present

## 2016-10-26 DIAGNOSIS — Z823 Family history of stroke: Secondary | ICD-10-CM | POA: Diagnosis not present

## 2016-10-26 DIAGNOSIS — Z833 Family history of diabetes mellitus: Secondary | ICD-10-CM | POA: Diagnosis not present

## 2016-10-26 DIAGNOSIS — Z836 Family history of other diseases of the respiratory system: Secondary | ICD-10-CM | POA: Insufficient documentation

## 2016-10-26 DIAGNOSIS — Z1212 Encounter for screening for malignant neoplasm of rectum: Secondary | ICD-10-CM | POA: Diagnosis not present

## 2016-10-26 DIAGNOSIS — Z791 Long term (current) use of non-steroidal anti-inflammatories (NSAID): Secondary | ICD-10-CM | POA: Diagnosis not present

## 2016-10-26 DIAGNOSIS — J45909 Unspecified asthma, uncomplicated: Secondary | ICD-10-CM | POA: Insufficient documentation

## 2016-10-26 DIAGNOSIS — Z1211 Encounter for screening for malignant neoplasm of colon: Secondary | ICD-10-CM

## 2016-10-26 DIAGNOSIS — Z9889 Other specified postprocedural states: Secondary | ICD-10-CM | POA: Diagnosis not present

## 2016-10-26 HISTORY — PX: COLONOSCOPY: SHX5424

## 2016-10-26 SURGERY — COLONOSCOPY
Anesthesia: Moderate Sedation

## 2016-10-26 MED ORDER — MIDAZOLAM HCL 5 MG/5ML IJ SOLN
INTRAMUSCULAR | Status: DC | PRN
  Administered 2016-10-26 (×2): 2 mg via INTRAVENOUS

## 2016-10-26 MED ORDER — SODIUM CHLORIDE 0.9 % IV SOLN
INTRAVENOUS | Status: DC
  Administered 2016-10-26: 11:00:00 via INTRAVENOUS

## 2016-10-26 MED ORDER — MEPERIDINE HCL 100 MG/ML IJ SOLN
INTRAMUSCULAR | Status: DC | PRN
Start: 2016-10-26 — End: 2016-10-26
  Administered 2016-10-26: 50 mg via INTRAVENOUS
  Administered 2016-10-26: 25 mg via INTRAVENOUS

## 2016-10-26 MED ORDER — MEPERIDINE HCL 100 MG/ML IJ SOLN
INTRAMUSCULAR | Status: AC
  Filled 2016-10-26: qty 2

## 2016-10-26 MED ORDER — STERILE WATER FOR IRRIGATION IR SOLN
Status: DC | PRN
  Administered 2016-10-26: 13:00:00

## 2016-10-26 MED ORDER — MIDAZOLAM HCL 5 MG/5ML IJ SOLN
INTRAMUSCULAR | Status: AC
  Filled 2016-10-26: qty 10

## 2016-10-26 NOTE — Discharge Instructions (Addendum)
You have small internal hemorrhoids and diverticulosis IN YOUR RIGHT COLON. YOU DID NOT HAVE ANY POLYPS.    DRINK WATER TO KEEP YOUR URINE LIGHT YELLOW.  CONTINUE YOUR WEIGHT LOSS EFFORTS.  FOLLOW A HIGH FIBER DIET. SEE INFO BELOW. YOU SHOULD TRANSITION TO A PLANT BASED DIET-NO MEAT OR DAIRY. AVOID ITEMS THAT CAUSE BLOATING & GAS. I RECOMMEND THE BOOK, "PREVENT AND REVERSE HEART DISEASE". CALDWELL ESSELSTYN JR., MD. PAGE 120-121 CLEARLY STATE THE RULES AND QUICK AND EASY RECIPES FOR BREAKFAST, LUNCH, AND DINNER ARE AFTER P 127.  Next colonoscopy in 10 years.   Colonoscopy Care After Read the instructions outlined below and refer to this sheet in the next week. These discharge instructions provide you with general information on caring for yourself after you leave the hospital. While your treatment has been planned according to the most current medical practices available, unavoidable complications occasionally occur. If you have any problems or questions after discharge, call DR. Tandi Hanko, [phone].  ACTIVITY  You may resume your regular activity, but move at a slower pace for the next 24 hours.   Take frequent rest periods for the next 24 hours.   Walking will help get rid of the air and reduce the bloated feeling in your belly (abdomen).   No driving for 24 hours (because of the medicine (anesthesia) used during the test).   You may shower.   Do not sign any important legal documents or operate any machinery for 24 hours (because of the anesthesia used during the test).    NUTRITION  Drink plenty of fluids.   You may resume your normal diet as instructed by your doctor.   Begin with a light meal and progress to your normal diet. Heavy or fried foods are harder to digest and may make you feel sick to your stomach (nauseated).   Avoid alcoholic beverages for 24 hours or as instructed.    MEDICATIONS  You may resume your normal medications.   WHAT YOU CAN EXPECT  TODAY  Some feelings of bloating in the abdomen.   Passage of more gas than usual.   Spotting of blood in your stool or on the toilet paper  .  IF YOU HAD POLYPS REMOVED DURING THE COLONOSCOPY:  Eat a soft diet IF YOU HAVE NAUSEA, BLOATING, ABDOMINAL PAIN, OR VOMITING.    FINDING OUT THE RESULTS OF YOUR TEST Not all test results are available during your visit. DR. Oneida Alar WILL CALL YOU WITHIN 7 DAYS OF YOUR PROCEDUE WITH YOUR RESULTS. Do not assume everything is normal if you have not heard from DR. Jaya Lapka IN ONE WEEK, CALL HER OFFICE AT [phone].  SEEK IMMEDIATE MEDICAL ATTENTION AND CALL THE OFFICE: [phone] IF:  You have more than a spotting of blood in your stool.   Your belly is swollen (abdominal distention).   You are nauseated or vomiting.   You have a temperature over 101F.   You have abdominal pain or discomfort that is severe or gets worse throughout the day.  High-Fiber Diet A high-fiber diet changes your normal diet to include more whole grains, legumes, fruits, and vegetables. Changes in the diet involve replacing refined carbohydrates with unrefined foods. The calorie level of the diet is essentially unchanged. The Dietary Reference Intake (recommended amount) for adult males is 38 grams per day. For adult females, it is 25 grams per day. Pregnant and lactating women should consume 28 grams of fiber per day. Fiber is the intact part of a plant that  is not broken down during digestion. Functional fiber is fiber that has been isolated from the plant to provide a beneficial effect in the body. PURPOSE  Increase stool bulk.   Ease and regulate bowel movements.   Lower cholesterol.   REDUCE RISK OF COLON CANCER  INDICATIONS THAT YOU NEED MORE FIBER  Constipation and hemorrhoids.   Uncomplicated diverticulosis (intestine condition) and irritable bowel syndrome.   Weight management.   As a protective measure against hardening of the arteries  (atherosclerosis), diabetes, and cancer.   GUIDELINES FOR INCREASING FIBER IN THE DIET  Start adding fiber to the diet slowly. A gradual increase of about 5 more grams (2 slices of whole-wheat bread, 2 servings of most fruits or vegetables, or 1 bowl of high-fiber cereal) per day is best. Too rapid an increase in fiber may result in constipation, flatulence, and bloating.   Drink enough water and fluids to keep your urine clear or pale yellow. Water, juice, or caffeine-free drinks are recommended. Not drinking enough fluid may cause constipation.   Eat a variety of high-fiber foods rather than one type of fiber.   Try to increase your intake of fiber through using high-fiber foods rather than fiber pills or supplements that contain small amounts of fiber.   The goal is to change the types of food eaten. Do not supplement your present diet with high-fiber foods, but replace foods in your present diet.   INCLUDE A VARIETY OF FIBER SOURCES  Replace refined and processed grains with whole grains, canned fruits with fresh fruits, and incorporate other fiber sources. White rice, white breads, and most bakery goods contain little or no fiber.   Brown whole-grain rice, buckwheat oats, and many fruits and vegetables are all good sources of fiber. These include: broccoli, Brussels sprouts, cabbage, cauliflower, beets, sweet potatoes, white potatoes (skin on), carrots, tomatoes, eggplant, squash, berries, fresh fruits, and dried fruits.   Cereals appear to be the richest source of fiber. Cereal fiber is found in whole grains and bran. Bran is the fiber-rich outer coat of cereal grain, which is largely removed in refining. In whole-grain cereals, the bran remains. In breakfast cereals, the largest amount of fiber is found in those with "bran" in their names. The fiber content is sometimes indicated on the label.   You may need to include additional fruits and vegetables each day.   In baking, for 1 cup  white flour, you may use the following substitutions:   1 cup whole-wheat flour minus 2 tablespoons.   1/2 cup white flour plus 1/2 cup whole-wheat flour.   Diverticulosis Diverticulosis is a common condition that develops when small pouches (diverticula) form in the wall of the colon. The risk of diverticulosis increases with age. It happens more often in people who eat a low-fiber diet. Most individuals with diverticulosis have no symptoms. Those individuals with symptoms usually experience belly (abdominal) pain, constipation, or loose stools (diarrhea).  HOME CARE INSTRUCTIONS  Increase the amount of fiber in your diet as directed by your caregiver or dietician. This may reduce symptoms of diverticulosis.   Drink at least 6 to 8 glasses of water each day to prevent constipation.   Try not to strain when you have a bowel movement.   Avoiding nuts and seeds to prevent complications is NOT NECESSARY.   FOODS HAVING HIGH FIBER CONTENT INCLUDE:  Fruits. Apple, peach, pear, tangerine, raisins, prunes.   Vegetables. Brussels sprouts, asparagus, broccoli, cabbage, carrot, cauliflower, romaine lettuce, spinach, summer squash,  tomato, winter squash, zucchini.   Starchy Vegetables. Baked beans, kidney beans, lima beans, split peas, lentils, potatoes (with skin).   Grains. Whole wheat bread, brown rice, bran flake cereal, plain oatmeal, white rice, shredded wheat, bran muffins.    SEEK IMMEDIATE MEDICAL CARE IF:  You develop increasing pain or severe bloating.   You have an oral temperature above 101F.   You develop vomiting or bowel movements that are bloody or black.   Hemorrhoids Hemorrhoids are dilated (enlarged) veins around the rectum. Sometimes clots will form in the veins. This makes them swollen and painful. These are called thrombosed hemorrhoids. Causes of hemorrhoids include:  Constipation.   Straining to have a bowel movement.   HEAVY LIFTING  HOME CARE  INSTRUCTIONS  Eat a well balanced diet and drink 6 to 8 glasses of water every day to avoid constipation. You may also use a bulk laxative.   Avoid straining to have bowel movements.   Keep anal area dry and clean.   Do not use a donut shaped pillow or sit on the toilet for long periods. This increases blood pooling and pain.   Move your bowels when your body has the urge; this will require less straining and will decrease pain and pressure.

## 2016-10-26 NOTE — H&P (Signed)
Primary Care Physician:  Claggett, Neita Goodnight, PA-C Primary Gastroenterologist:  Dr. Oneida Alar  Pre-Procedure History & Physical: HPI:  Catherine Hansen is a 56 y.o. female here for [place].  Past Medical History:  Diagnosis Date  . Asthma   . Breast cancer ([place]) 12/09/2015   Pathology showed pleomorphic lobular carcinoma in situ with comedonecrosis and calcifications   . Breast disorder    LCIS right  . Flu 08/27/2015  . Hematuria 12/25/2014  . Herpes simplex without mention of complication   . History of herpes simplex infection 08/08/2013  . Hypertension   . Migraine   . Pelvic pain in female 12/25/2014  . Vaginal Pap smear, abnormal    Past Surgical History:  Procedure Laterality Date  . BREAST BIOPSY Right   . BREAST LUMPECTOMY WITH RADIOACTIVE SEED LOCALIZATION Left 01/15/2016   Procedure: BREAST LUMPECTOMY WITH RADIOACTIVE SEED LOCALIZATION;  Surgeon: Donnie Mesa, MD;  Location: [place];  Service: General;  Laterality: Left;  . cervix frozen     cells on cervix frozen  . CESAREAN SECTION      Prior to Admission medications   Medication Sig Start Date End Date Taking? Authorizing Provider  albuterol (PROVENTIL HFA;VENTOLIN HFA) 108 (90 Base) MCG/ACT inhaler Inhale 2 puffs into the lungs every 4 (four) hours as needed for wheezing or shortness of breath.   Yes [provider]  amLODipine (NORVASC) 10 MG tablet Take 10 mg by mouth daily.   Yes [provider]  Ascorbic Acid (VITAMIN C) 1000 MG tablet Take 1,000 mg by mouth daily.   Yes [provider]  aspirin 81 MG tablet Take 81 mg by mouth daily.   Yes [provider]  B Complex Vitamins (VITAMIN B COMPLEX PO) Take 1,000 mg by mouth daily.    Yes [provider]  cetirizine (ZYRTEC) 10 MG tablet Take 10 mg by mouth daily.   Yes [provider]  COD LIVER OIL PO Take 1 capsule by mouth daily.    Yes [provider]  Coenzyme Q10 (COQ-10 PO)  Take 1 capsule by mouth daily.    Yes [provider]  fluticasone (FLONASE) 50 MCG/ACT nasal spray Place 2 sprays into both nostrils daily.   Yes [provider]  furosemide (LASIX) 40 MG tablet Take 40 mg by mouth daily.    Yes [provider]  naproxen (NAPROSYN) 500 MG tablet Take 500 mg by mouth daily.   Yes [provider]  pravastatin (PRAVACHOL) 20 MG tablet Take 20 mg by mouth daily.   Yes [provider]  promethazine (PHENERGAN) 25 MG tablet Take 1 tablet (25 mg total) by mouth every 6 (six) hours as needed for nausea or vomiting. 09/15/16  Yes Derrek Monaco A, NP  spironolactone (ALDACTONE) 50 MG tablet Take 50 mg by mouth daily.   Yes [provider]  tamoxifen (NOLVADEX) 20 MG tablet Take 1 tablet (20 mg total) by mouth daily. 01/22/16  Yes Nicholas Lose, MD  valACYclovir (VALTREX) 1000 MG tablet Take 1 tablet (1,000 mg total) by mouth daily. 09/15/16  Yes Estill Dooms, NP    Allergies as of 09/24/2016 - Review Complete 09/23/2016  Allergen Reaction Noted  . Diphenhydramine Swelling 06/10/2013  . Hctz [hydrochlorothiazide] Swelling 06/10/2013  . Lisinopril Swelling 06/10/2013  . Other Hives 01/09/2016  . Tuberculin tests Hives 01/09/2016    Family History  Problem Relation Age of Onset  . COPD Mother   . Cancer Mother  uterus,lung  . Diabetes Mother   . Hypertension Mother   . Heart disease Mother   . Early death Father   . Kidney disease Father   . Hypertension Father   . Hypertension Sister   . Hypertension Brother   . Cancer Maternal Aunt   . Stroke Maternal Grandfather   . Stroke Paternal Grandmother   . Stroke Paternal Grandfather   . Hypertension Sister   . Hypertension Sister   . Early death Sister   . Heart failure Sister   . Kidney disease Maternal Aunt   . Heart disease Maternal Aunt     Social History   Social History  . Marital status: Legally Separated    Spouse name: N/A   . Number of children: N/A  . Years of education: N/A   Occupational History  . Not on file.   Social History Main Topics  . Smoking status: Never Smoker  . Smokeless tobacco: Never Used  . Alcohol use No  . Drug use: No  . Sexual activity: Yes    Birth control/ protection: Condom   Other Topics Concern  . Not on file   Social History Narrative  . No narrative on file    Review of Systems: See HPI, otherwise negative ROS   Physical Exam: BP (!) 124/50   Pulse 71   Temp 98.7 F (37.1 C) (Oral)   Resp 17   Ht 5' 8.5" (1.74 m)   Wt 232 lb (105.2 kg)   LMP 02/08/2013 (Exact Date)   SpO2 99%   BMI 34.76 kg/m  General:   Alert,  pleasant and cooperative in NAD Head:  Normocephalic and atraumatic. Neck:  Supple; Lungs:  Clear throughout to auscultation.    Heart:  Regular rate and rhythm. Abdomen:  Soft, nontender and nondistended. Normal bowel sounds, without guarding, and without rebound.   Neurologic:  Alert and  oriented x4;  grossly normal neurologically.  Impression/Plan:     SCREENING  Plan:  1. TCS TODAY. DISCUSSED PROCEDURE, BENEFITS, & RISKS: < 1% chance of medication reaction, bleeding, perforation, or rupture of spleen/liver.

## 2016-10-26 NOTE — Op Note (Signed)
[facility] Patient Name: Catherine Hansen Procedure Date: 10/26/2016 12:29 PM MRN: [id number] Date of Birth: 1960/08/17 Attending MD: Barney Drain , MD CSN: [id number] Age: 56 Admit Type: Outpatient Procedure:                Colonoscopy, SCREENING Indications:              Screening for colorectal malignant neoplasm Providers:                Barney Drain, MD, Rosina Lowenstein, RN, Aram Candela Referring MD:             Abran Richard Medicines:                Meperidine 75 mg IV, Midazolam 4 mg IV Complications:            No immediate complications. Estimated Blood Loss:     Estimated blood loss: none. Procedure:                Pre-Anesthesia Assessment:                           - Prior to the procedure, a History and Physical                            was performed, and patient medications and                            allergies were reviewed. The patient's tolerance of                            previous anesthesia was also reviewed. The risks                            and benefits of the procedure and the sedation                            options and risks were discussed with the patient.                            All questions were answered, and informed consent                            was obtained. Prior Anticoagulants: The patient has                            taken aspirin, last dose was 1 day prior to                            procedure. ASA Grade Assessment: II - A patient                            with mild systemic disease. After reviewing the                            risks and benefits, the patient was deemed in  satisfactory condition to undergo the procedure.                            After obtaining informed consent, the colonoscope                            was passed under direct vision. Throughout the                            procedure, the patient's blood pressure, pulse, and                            oxygen saturations  were monitored continuously. The                            [id number] ([id number]) scope was introduced through                            the anus and advanced to the the cecum, identified                            by appendiceal orifice and ileocecal valve. The                            colonoscopy was technically difficult and complex                            due to significant looping. Successful completion                            of the procedure was aided by increasing the dose                            of sedation medication, straightening and                            shortening the scope to obtain bowel loop reduction                            and COLOWRAP. The patient tolerated the procedure                            fairly well. The quality of the bowel preparation                            was good. The ileocecal valve, appendiceal orifice,                            and rectum were photographed. Scope In: 12:55:42 PM Scope Out: 1:10:59 PM Scope Withdrawal Time: 0 hours 13 minutes 0 seconds  Total Procedure Duration: 0 hours 15 minutes 17 seconds  Findings:      The recto-sigmoid colon, sigmoid colon and descending colon were       significantly redundant.  A few small-mouthed diverticula were found in the hepatic flexure.      Internal hemorrhoids were found during retroflexion. The hemorrhoids       were moderate. Impression:               - Redundant LEFT colon.                           - Diverticulosis in the HEPATIC FLEXURE                           - SMALL Internal hemorrhoids. Moderate Sedation:      Moderate (conscious) sedation was administered by the endoscopy nurse       and supervised by the endoscopist. The following parameters were       monitored: oxygen saturation, heart rate, blood pressure, and response       to care. Total physician intraservice time was 24 minutes. Recommendation:           - Repeat colonoscopy in 10 years for  surveillance.                           - High fiber diet and low fat diet. TRANISTION TO                            PLANT BASED DIET. CONTINUE WEIGHT LOSS EFFORTS.                           - Continue present medications.                           - Patient has a contact number available for                            emergencies. The signs and symptoms of potential                            delayed complications were discussed with the                            patient. Return to normal activities tomorrow.                            Written discharge instructions were provided to the                            patient. Procedure Code(s):        --- Professional ---                           [phone], Colonoscopy, flexible; diagnostic, including                            collection of specimen(s) by brushing or washing,                            when performed (separate procedure)  74944, Moderate sedation services provided by the                            same physician or other qualified health care                            professional performing the diagnostic or                            therapeutic service that the sedation supports,                            requiring the presence of an independent trained                            observer to assist in the monitoring of the                            patient's level of consciousness and physiological                            status; initial 15 minutes of intraservice time,                            patient age 53 years or older                           [phone], Moderate sedation services; each additional                            15 minutes intraservice time Diagnosis Code(s):        --- Professional ---                           Z12.11, Encounter for screening for malignant                            neoplasm of colon                           K64.8, Other hemorrhoids                            K57.30, Diverticulosis of large intestine without                            perforation or abscess without bleeding                           Q43.8, Other specified congenital malformations of                            intestine CPT copyright 2016 [employer]. All rights reserved. The codes documented in this report are preliminary and upon coder review may  be revised to meet current compliance requirements. Barney Drain, MD Carlyon Prows  Magdalyn Arenivas, MD 10/26/2016 1:25:59 PM This report has been signed electronically. Number of Addenda: 0

## 2016-10-27 ENCOUNTER — Ambulatory Visit: Payer: BLUE CROSS/BLUE SHIELD | Admitting: Hematology and Oncology

## 2016-10-28 NOTE — Assessment & Plan Note (Signed)
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 Tamoxifen 20 mg daily Started September 2017. 2. Recommended annual mammograms and breast exams.  3. Nonpharmacological measures including exercise,weight loss,reducing red meat intake  Tamoxifen Toxicities: 1. Severe hot flashes  Return to clinic in 1 yr.   

## 2016-10-29 ENCOUNTER — Ambulatory Visit (HOSPITAL_BASED_OUTPATIENT_CLINIC_OR_DEPARTMENT_OTHER): Payer: BLUE CROSS/BLUE SHIELD | Admitting: Hematology and Oncology

## 2016-10-29 ENCOUNTER — Encounter: Payer: Self-pay | Admitting: Hematology and Oncology

## 2016-10-29 DIAGNOSIS — N951 Menopausal and female climacteric states: Secondary | ICD-10-CM

## 2016-10-29 DIAGNOSIS — D0501 Lobular carcinoma in situ of right breast: Secondary | ICD-10-CM | POA: Diagnosis not present

## 2016-10-29 MED ORDER — TAMOXIFEN CITRATE 20 MG PO TABS: 20.0000 mg | ORAL_TABLET | Freq: Every day | ORAL | 3 refills | Status: DC

## 2016-10-29 NOTE — Progress Notes (Signed)
Patient Care Team: Claggett, Neita Goodnight, PA-C as PCP - General (Family Medicine)  DIAGNOSIS:  Encounter Diagnosis  Name Primary?  . 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 She is tolerating tamoxifen fairly well. Her biggest complaint is related to hot flashes. These have been manageable. She denies any lumps or nodules in the breast.  REVIEW OF SYSTEMS:   Constitutional: Denies fevers, chills or abnormal weight loss Eyes: Denies blurriness of vision Ears, nose, mouth, throat, and face: Denies mucositis or sore throat Respiratory: Denies cough, dyspnea or wheezes Cardiovascular: Denies palpitation, chest discomfort Gastrointestinal:  Denies nausea, heartburn or change in bowel habits Skin: Denies abnormal skin rashes Lymphatics: Denies new lymphadenopathy or easy bruising Neurological:Denies numbness, tingling or new weaknesses Behavioral/Psych: Mood is stable, no new changes  Extremities: No lower extremity edema Breast:  denies any pain or lumps or nodules in either breasts All other systems were reviewed with the patient and are negative.  I have reviewed the past medical history, past surgical history, social history and family  history with the patient and they are unchanged from previous note.  ALLERGIES:  is allergic to diphenhydramine; hctz [hydrochlorothiazide]; lisinopril; other; and tuberculin tests.  MEDICATIONS:  Current Outpatient Prescriptions  Medication Sig Dispense Refill  . albuterol (PROVENTIL HFA;VENTOLIN HFA) 108 (90 Base) MCG/ACT inhaler Inhale 2 puffs into the lungs every 4 (four) hours as needed for wheezing or shortness of breath.    Marland Kitchen amLODipine (NORVASC) 10 MG tablet Take 10 mg by mouth daily.    . Ascorbic Acid (VITAMIN C) 1000 MG tablet Take 1,000 mg by mouth daily.    Marland Kitchen aspirin 81 MG tablet Take 81 mg by mouth daily.    . B Complex Vitamins (VITAMIN B COMPLEX PO) Take 1,000 mg by mouth daily.     . cetirizine (ZYRTEC) 10 MG tablet Take 10 mg by mouth daily.    . COD LIVER OIL PO Take 1 capsule by mouth daily.     . Coenzyme Q10 (COQ-10 PO) Take 1 capsule by mouth daily.     . fluticasone (FLONASE) 50 MCG/ACT nasal spray Place 2 sprays into both nostrils daily.    . furosemide (LASIX) 40 MG tablet Take 40 mg by mouth daily.     . naproxen (NAPROSYN) 500 MG tablet Take 500 mg by mouth daily.    . pravastatin (PRAVACHOL) 20 MG tablet Take 20 mg by mouth daily.    . promethazine (PHENERGAN) 25 MG tablet Take 1 tablet (25 mg total) by mouth every 6 (six) hours as needed for nausea or vomiting. 30 tablet 1  . spironolactone (ALDACTONE) 50 MG tablet Take 50 mg by mouth daily.    . tamoxifen (NOLVADEX) 20 MG tablet  Take 1 tablet (20 mg total) by mouth daily. 90 tablet 3  . valACYclovir (VALTREX) 1000 MG tablet Take 1 tablet (1,000 mg total) by mouth daily. 30 tablet 12   No current facility-administered medications for this visit.     PHYSICAL EXAMINATION: ECOG PERFORMANCE STATUS: 1 - Symptomatic but completely ambulatory  Vitals:   10/29/16 1014  BP: 130/77  Pulse: 81  Resp: 18  Temp: 98 F (36.7 C)   Filed Weights   10/29/16 1014  Weight: 228 lb 1.6 oz (103.5 kg)     GENERAL:alert, no distress and comfortable SKIN: skin color, texture, turgor are normal, no rashes or significant lesions EYES: normal, Conjunctiva are pink and non-injected, sclera clear OROPHARYNX:no exudate, no erythema and lips, buccal mucosa, and tongue normal  NECK: supple, thyroid normal size, non-tender, without nodularity LYMPH:  no palpable lymphadenopathy in the cervical, axillary or inguinal LUNGS: clear to auscultation and percussion with normal breathing effort HEART: regular rate & rhythm and no murmurs and no lower extremity edema ABDOMEN:abdomen soft, non-tender and normal bowel sounds MUSCULOSKELETAL:no cyanosis of digits and no clubbing  NEURO: alert & oriented x 3 with fluent speech, no focal motor/sensory deficits EXTREMITIES: No lower extremity edema BREAST: No palpable masses or nodules in either right or left breasts. No palpable axillary supraclavicular or infraclavicular adenopathy no breast tenderness or nipple discharge. (exam performed in the presence of a chaperone)  LABORATORY DATA:  I have reviewed the data as listed   Chemistry      Component Value Date/Time   NA 138 01/13/2016 0920   K 3.7 01/13/2016 0920   CL 105 01/13/2016 0920   CO2 26 01/13/2016 0920   BUN 16 01/13/2016 0920   CREATININE 0.59 01/13/2016 0920      Component Value Date/Time   CALCIUM 8.7 (L) 01/13/2016 0920       Lab Results  Component Value Date   WBC 8.2 04/03/2010   HGB 13.6 04/03/2010   HCT 40.0 04/03/2010   MCV 76.7 (L) 04/03/2010   PLT 231 04/03/2010   NEUTROABS 4.6 04/03/2010    ASSESSMENT & PLAN:  Neoplasm of right breast, primary tumor staging category Tis: lobular carcinoma in situ (LCIS) Right breast biopsy 12/03/2015 upper outer quadrant: 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 Tamoxifen 20 mg daily Started September  2017 . 2. Recommended annual mammograms and breast exams.  3. Nonpharmacological measures including exercise,weight loss,reducing red meat intake  Tamoxifen Toxicities: 1. Severe hot flashes:   Return to clinic in 1 yr.    I spent 25 minutes talking to the patient of which more than half was spent in counseling and coordination of care.  No orders of the defined types were placed in this encounter.  The patient has a good understanding of the overall plan. she agrees with it. she will call with any problems that may develop before the next visit here.   Rulon Eisenmenger, MD 10/29/16

## 2016-11-03 ENCOUNTER — Encounter (HOSPITAL_COMMUNITY): Payer: Self-pay | Admitting: Gastroenterology

## 2016-11-05 ENCOUNTER — Encounter: Payer: Self-pay | Admitting: Hematology and Oncology

## 2016-11-05 NOTE — Progress Notes (Signed)
Pt informed me that a foundation will be assisting her with her office visit copays.  She gave me consent to send the HCFA 1500 form for 10/29/16 dos and a letter stating how much her copay is to [employer].  I faxed them to [phone].

## 2016-11-10 ENCOUNTER — Other Ambulatory Visit: Payer: Self-pay | Admitting: Hematology and Oncology

## 2016-11-10 DIAGNOSIS — N6320 Unspecified lump in the left breast, unspecified quadrant: Secondary | ICD-10-CM

## 2016-11-10 DIAGNOSIS — N6310 Unspecified lump in the right breast, unspecified quadrant: Secondary | ICD-10-CM

## 2016-12-01 ENCOUNTER — Encounter (HOSPITAL_COMMUNITY): Payer: BLUE CROSS/BLUE SHIELD

## 2016-12-01 ENCOUNTER — Ambulatory Visit (HOSPITAL_COMMUNITY)
Admission: RE | Admit: 2016-12-01 | Discharge: 2016-12-01 | Disposition: A | Discharge status: Home / Self Care | Payer: BLUE CROSS/BLUE SHIELD | Source: Ambulatory Visit | Attending: Hematology and Oncology | Admitting: Hematology and Oncology

## 2016-12-01 DIAGNOSIS — D0501 Lobular carcinoma in situ of right breast: Secondary | ICD-10-CM | POA: Diagnosis present

## 2016-12-30 ENCOUNTER — Telehealth: Payer: Self-pay | Admitting: *Deleted

## 2016-12-30 NOTE — Telephone Encounter (Signed)
Called pt to follow up further on her symptoms about a soft lump on her L side, near the armpit, the size smaller than a dime. Pt noticed only a few days ago. Pt states all symptoms she mentioned to the triage nurse. Told pt that she recently had a MM that was normal and no malignancy. Explained to pt that it could be a possible cyst (fatty tissue) or a swollen lymph nodes due to a viral infection or inflammation in the body. Pt states that she is on tamoxifen and had been taking this for 1 year and is tolerating well.  Explained to pt to closely monitor for 1-2 weeks. Most lymph nodes go down to normal size in a few weeks. Told pt to notify if she starts to feel pain, notice redness, swelling of surrounding tissue, fevers and discoloration around the site. Pt verbalized understanding and will keep a close eye on it. No further questions at this time.

## 2016-12-30 NOTE — Telephone Encounter (Signed)
"  A few days ago, I noticed a new knot on the left breast near the armpit at three o'clock position.  Not under the armpit bit visible from the front.  It's not hard but feels soft and moves.  Do I need to be seen or what?  No pain or soreness. No warmth.  Not red but color possibly of a bruise.  No lifting, moving or injuries.  Return number [phone]." Mammogram performed 12-01-2016. Next annual F/U 11-02-2017.

## 2017-02-11 ENCOUNTER — Encounter (INDEPENDENT_AMBULATORY_CARE_PROVIDER_SITE_OTHER): Payer: Self-pay

## 2017-02-11 ENCOUNTER — Ambulatory Visit (INDEPENDENT_AMBULATORY_CARE_PROVIDER_SITE_OTHER): Payer: BLUE CROSS/BLUE SHIELD | Admitting: Obstetrics & Gynecology

## 2017-02-11 ENCOUNTER — Encounter: Payer: Self-pay | Admitting: Obstetrics & Gynecology

## 2017-02-11 VITALS — BP 110/80 | HR 84 | Wt 226.0 lb

## 2017-02-11 DIAGNOSIS — N3000 Acute cystitis without hematuria: Secondary | ICD-10-CM

## 2017-02-11 DIAGNOSIS — R300 Dysuria: Secondary | ICD-10-CM

## 2017-02-11 LAB — POCT URINALYSIS DIPSTICK
Glucose, UA: NEGATIVE
Leukocytes, UA: NEGATIVE
Nitrite, UA: NEGATIVE
RBC UA: NEGATIVE

## 2017-02-11 MED ORDER — SULFAMETHOXAZOLE-TRIMETHOPRIM 800-160 MG PO TABS: 1.0000 | ORAL_TABLET | Freq: Two times a day (BID) | ORAL | 0 refills | Status: DC

## 2017-02-11 NOTE — Progress Notes (Signed)
.          Chief Complaint  Patient presents with  . gyn visit    pressure when pee    Blood pressure 110/80, pulse 84, weight 226 lb (102.5 kg), last menstrual period 02/08/2013.  56 y.o. G3P3 Patient's last menstrual period was 02/08/2013 (exact date). The current method of family planning is tubal ligation.  Outpatient Encounter Prescriptions as of 02/11/2017  Medication Sig  . amLODipine (NORVASC) 10 MG tablet Take 10 mg by mouth daily.  . Ascorbic Acid (VITAMIN C) 1000 MG tablet Take 1,000 mg by mouth daily.  Marland Kitchen aspirin 81 MG tablet Take 81 mg by mouth daily.  . B Complex Vitamins (VITAMIN B COMPLEX PO) Take 1,000 mg by mouth daily.   . cetirizine (ZYRTEC) 10 MG tablet Take 10 mg by mouth daily.  . COD LIVER OIL PO Take 1 capsule by mouth daily.   . Coenzyme Q10 (COQ-10 PO) Take 1 capsule by mouth daily.   . fluticasone (FLONASE) 50 MCG/ACT nasal spray Place 2 sprays into both nostrils daily.  . furosemide (LASIX) 40 MG tablet Take 40 mg by mouth daily.   . naproxen (NAPROSYN) 500 MG tablet Take 500 mg by mouth daily.  . pravastatin (PRAVACHOL) 20 MG tablet Take 20 mg by mouth daily.  . promethazine (PHENERGAN) 25 MG tablet Take 1 tablet (25 mg total) by mouth every 6 (six) hours as needed for nausea or vomiting.  Marland Kitchen spironolactone (ALDACTONE) 50 MG tablet Take 50 mg by mouth daily.  . tamoxifen (NOLVADEX) 20 MG tablet Take 1 tablet (20 mg total) by mouth daily.  . valACYclovir (VALTREX) 1000 MG tablet Take 1 tablet (1,000 mg total) by mouth daily.  Marland Kitchen albuterol (PROVENTIL HFA;VENTOLIN HFA) 108 (90 Base) MCG/ACT inhaler Inhale 2 puffs into the lungs every 4 (four) hours as needed for wheezing or shortness of breath.  . sulfamethoxazole-trimethoprim (BACTRIM DS,SEPTRA DS) 800-160 MG tablet Take 1 tablet by mouth 2 (two) times daily.   No facility-administered encounter medications on file as of 02/11/2017.     Subjective Urinary Tract Infection Patient complains of frequency  and suprapubic pressure. She has had symptoms for 1 week. Patient also complains of n/a. Patient denies back pain, fever and vaginal odor or bleeding. Patient does not have a history of recurrent UTI. Patient does not have a history of pyelonephritis.   Objective General WDWN female NAD Vulva:  normal appearing vulva with no masses, tenderness or lesions Vagina:  normal mucosa, no discharge Cervix:  no cervical motion tenderness and no lesions Uterus:  normal size, contour, position, consistency, mobility, non-tender Adnexa: ovaries:present,  normal adnexa in size, nontender and no masses   Pertinent ROS  No nausea, vomiting or diarrhea Nor fever chills or other constitutional symptoms   Labs or studies Urinalysis is negative    Impression Diagnoses this Encounter::   ICD-10-CM   1. Acute cystitis without hematuria N30.00   2. Dysuria R30.0 POCT urinalysis dipstick    Established relevant diagnosis(es):   Plan/Recommendations: Meds ordered this encounter  Medications  . sulfamethoxazole-trimethoprim (BACTRIM DS,SEPTRA DS) 800-160 MG tablet    Sig: Take 1 tablet by mouth 2 (two) times daily.    Dispense:  14 tablet    Refill:  0    Labs or Scans Ordered: Orders Placed This Encounter  Procedures  . POCT urinalysis dipstick    Management:: Urine culture Bactrim ds for 14 days Follow up Return if symptoms worsen or fail to improve.  All questions were answered.  Past Medical History:  Diagnosis Date  . Asthma   . Breast cancer ([place]) 12/09/2015   Pathology showed pleomorphic lobular carcinoma in situ with comedonecrosis and calcifications   . Breast disorder    LCIS right  . Flu 08/27/2015  . Hematuria 12/25/2014  . Herpes simplex without mention of complication   . History of herpes simplex infection 08/08/2013  . Hypertension   . Migraine   . Pelvic pain in female 12/25/2014  . Vaginal Pap smear, abnormal     Past Surgical History:    Procedure Laterality Date  . BREAST BIOPSY Right   . BREAST LUMPECTOMY WITH RADIOACTIVE SEED LOCALIZATION Left 01/15/2016   Procedure: BREAST LUMPECTOMY WITH RADIOACTIVE SEED LOCALIZATION;  Surgeon: Donnie Mesa, MD;  Location: [place];  Service: General;  Laterality: Left;  . cervix frozen     cells on cervix frozen  . CESAREAN SECTION    . COLONOSCOPY N/A 10/26/2016   Procedure: COLONOSCOPY;  Surgeon: Danie Binder, MD;  [facility];  Service: Endoscopy;  Laterality: N/A;  12:00 pm    OB History    Gravida Para Term Preterm AB Living   3 3       2    SAB TAB Ectopic Multiple Live Births           3      Allergies  Allergen Reactions  . Diphenhydramine Swelling    Swelling of face of tongue  . Hctz [Hydrochlorothiazide] Swelling    Swelling of face and tongue  . Lisinopril Swelling    Swelling of face and tongue  . Other Hives    Skin so soft deoderant  . Tuberculin Tests Hives    Social History   Social History  . Marital status: Legally Separated    Spouse name: N/A  . Number of children: N/A  . Years of education: N/A   Social History Main Topics  . Smoking status: Never Smoker  . Smokeless tobacco: Never Used  . Alcohol use No  . Drug use: No  . Sexual activity: Yes    Birth control/ protection: Condom   Other Topics Concern  . None   Social History Narrative  . None    Family History  Problem Relation Age of Onset  . COPD Mother   . Cancer Mother        uterus,lung  . Diabetes Mother   . Hypertension Mother   . Heart disease Mother   . Early death Father   . Kidney disease Father   . Hypertension Father   . Hypertension Sister   . Hypertension Brother   . Cancer Maternal Aunt   . Stroke Maternal Grandfather   . Stroke Paternal Grandmother   . Stroke Paternal Grandfather   . Hypertension Sister   . Hypertension Sister   . Early death Sister   . Heart failure Sister   . Kidney disease Maternal Aunt   .  Heart disease Maternal Aunt

## 2017-02-11 NOTE — Addendum Note (Signed)
Addended by: Diona Fanti A on: 02/11/2017 02:44 PM   Modules accepted: Orders

## 2017-02-13 LAB — URINE CULTURE

## 2017-02-27 ENCOUNTER — Other Ambulatory Visit: Payer: Self-pay | Admitting: Adult Health

## 2017-07-14 ENCOUNTER — Telehealth: Payer: Self-pay | Admitting: Adult Health

## 2017-07-14 MED ORDER — VALACYCLOVIR HCL 1 G PO TABS: ORAL_TABLET | ORAL | 6 refills | Status: DC

## 2017-07-14 NOTE — Telephone Encounter (Signed)
Left message that valtrex is there, I spoke with Nicki Reaper

## 2017-07-19 ENCOUNTER — Other Ambulatory Visit: Payer: Self-pay | Admitting: Surgery

## 2017-07-19 DIAGNOSIS — N6321 Unspecified lump in the left breast, upper outer quadrant: Secondary | ICD-10-CM

## 2017-07-23 ENCOUNTER — Ambulatory Visit
Admission: RE | Admit: 2017-07-23 | Discharge: 2017-07-23 | Disposition: A | Discharge status: Home / Self Care | Payer: BLUE CROSS/BLUE SHIELD | Source: Ambulatory Visit | Attending: Surgery | Admitting: Surgery

## 2017-07-23 DIAGNOSIS — N6321 Unspecified lump in the left breast, upper outer quadrant: Secondary | ICD-10-CM

## 2017-07-27 NOTE — Progress Notes (Signed)
Please call the patient and let them know that their studies showed no abnormality in the left breast.  We can see her in one year after her next set of mammograms.  She should let us know if her self-examination changes.

## 2017-09-21 ENCOUNTER — Ambulatory Visit (INDEPENDENT_AMBULATORY_CARE_PROVIDER_SITE_OTHER): Payer: BLUE CROSS/BLUE SHIELD | Admitting: Adult Health

## 2017-09-21 ENCOUNTER — Encounter: Payer: Self-pay | Admitting: Adult Health

## 2017-09-21 ENCOUNTER — Encounter (INDEPENDENT_AMBULATORY_CARE_PROVIDER_SITE_OTHER): Payer: Self-pay

## 2017-09-21 VITALS — BP 110/80 | HR 96 | Ht 68.2 in | Wt 235.0 lb

## 2017-09-21 DIAGNOSIS — R05 Cough: Secondary | ICD-10-CM | POA: Diagnosis not present

## 2017-09-21 DIAGNOSIS — Z1211 Encounter for screening for malignant neoplasm of colon: Secondary | ICD-10-CM | POA: Diagnosis not present

## 2017-09-21 DIAGNOSIS — Z1159 Encounter for screening for other viral diseases: Secondary | ICD-10-CM

## 2017-09-21 DIAGNOSIS — Z01411 Encounter for gynecological examination (general) (routine) with abnormal findings: Secondary | ICD-10-CM | POA: Diagnosis not present

## 2017-09-21 DIAGNOSIS — Z13220 Encounter for screening for lipoid disorders: Secondary | ICD-10-CM | POA: Diagnosis not present

## 2017-09-21 DIAGNOSIS — Z113 Encounter for screening for infections with a predominantly sexual mode of transmission: Secondary | ICD-10-CM

## 2017-09-21 DIAGNOSIS — Z853 Personal history of malignant neoplasm of breast: Secondary | ICD-10-CM | POA: Diagnosis not present

## 2017-09-21 DIAGNOSIS — Z1212 Encounter for screening for malignant neoplasm of rectum: Secondary | ICD-10-CM | POA: Diagnosis not present

## 2017-09-21 DIAGNOSIS — Z1329 Encounter for screening for other suspected endocrine disorder: Secondary | ICD-10-CM

## 2017-09-21 DIAGNOSIS — Z01419 Encounter for gynecological examination (general) (routine) without abnormal findings: Secondary | ICD-10-CM

## 2017-09-21 LAB — HEMOCCULT GUIAC POC 1CARD (OFFICE): Fecal Occult Blood, POC: NEGATIVE

## 2017-09-21 NOTE — Progress Notes (Signed)
Patient ID: Catherine Hansen, female   DOB: [birth year], 57 y.o.   MRN: [id number] History of Present Illness: Catherine Hansen is a 57 year old black female, G3P3, PM in for a well woman gyn exam,she had a normal pap with negative HPV 08/27/15.   Current Medications, Allergies, Past Medical History, Past Surgical History, Family History and Social History were reviewed in [employer] record.     Review of Systems: Patient denies any headaches, hearing loss, fatigue, blurred vision, shortness of breath, chest pain, abdominal pain, problems with bowel movements, urination, or intercourse. No joint pain or mood swings. +cough after recent URI   Physical Exam:BP 110/80 (BP Location: Left Arm, Patient Position: Sitting, Cuff Size: Large)   Pulse 96   Ht 5' 8.2" (1.732 m)   Wt 235 lb (106.6 kg)   LMP 02/08/2013 (Exact Date)   BMI 35.52 kg/m  General:  Well developed, well nourished, no acute distress Skin:  Warm and dry Neck:  Midline trachea, normal thyroid, good ROM, no lymphadenopathy Lungs; Clear to auscultation bilaterally Breast:  No dominant palpable mass, retraction, or nipple discharge Cardiovascular: Regular rate and rhythm Abdomen:  Soft, non tender, no hepatosplenomegaly Pelvic:  External genitalia is normal in appearance, no lesions.  The vagina is normal in appearance. Urethra has no lesions or masses. The cervix is smooth.  Uterus is felt to be normal size, shape, and contour.  No adnexal masses or tenderness noted.Bladder is non tender, no masses felt. Rectal: Good sphincter tone, no polyps, or hemorrhoids felt.  Hemoccult negative. Extremities/musculoskeletal:  No swelling or varicosities noted, no clubbing or cyanosis Psych:  No mood changes, alert and cooperative,seems happy PHQ 9 score 1.  Impression: 1. Encounter for well woman exam with routine gynecological exam   2. Screening for colorectal cancer   3. History of breast cancer   4. Screening cholesterol  level   5. Screening for thyroid disorder   6. Need for hepatitis C screening test   7. Screening examination for STD (sexually transmitted disease)       Plan:  Check CBC,CMP,TSH and lipids,HIV and hepatitis C antiblody  Pap and physical in 1 year Mammogram yearly Colonoscopy per GI

## 2017-09-23 ENCOUNTER — Telehealth: Payer: Self-pay | Admitting: Adult Health

## 2017-09-23 LAB — COMPREHENSIVE METABOLIC PANEL
ALT: 18 IU/L (ref 0–32)
AST: 20 IU/L (ref 0–40)
Albumin/Globulin Ratio: 1.6 (ref 1.2–2.2)
Albumin: 4.1 g/dL (ref 3.5–5.5)
Alkaline Phosphatase: 50 IU/L (ref 39–117)
BUN/Creatinine Ratio: 19 (ref 9–23)
BUN: 13 mg/dL (ref 6–24)
Bilirubin Total: 0.3 mg/dL (ref 0.0–1.2)
CALCIUM: 9.3 mg/dL (ref 8.7–10.2)
CO2: 24 mmol/L (ref 20–29)
CREATININE: 0.69 mg/dL (ref 0.57–1.00)
Chloride: 105 mmol/L (ref 96–106)
GFR, EST AFRICAN AMERICAN: 111 mL/min/{1.73_m2} (ref >59)
GFR, EST NON AFRICAN AMERICAN: 96 mL/min/{1.73_m2} (ref >59)
Globulin, Total: 2.6 g/dL (ref 1.5–4.5)
Glucose: 99 mg/dL (ref 65–99)
Potassium: 4.5 mmol/L (ref 3.5–5.2)
Sodium: 141 mmol/L (ref 134–144)
TOTAL PROTEIN: 6.7 g/dL (ref 6.0–8.5)

## 2017-09-23 LAB — CBC
HEMATOCRIT: 38.3 % (ref 34.0–46.6)
Hemoglobin: 12.5 g/dL (ref 11.1–15.9)
MCH: 26.7 pg (ref 26.6–33.0)
MCHC: 32.6 g/dL (ref 31.5–35.7)
MCV: 82 fL (ref 79–97)
PLATELETS: 224 10*3/uL (ref 150–379)
RBC: 4.68 x10E6/uL (ref 3.77–5.28)
RDW: 15.9 % — AB (ref 12.3–15.4)
WBC: 5.5 10*3/uL (ref 3.4–10.8)

## 2017-09-23 LAB — LIPID PANEL
CHOL/HDL RATIO: 2.5 ratio (ref 0.0–4.4)
CHOLESTEROL TOTAL: 118 mg/dL (ref 100–199)
HDL: 47 mg/dL (ref >39)
LDL CALC: 60 mg/dL (ref 0–99)
TRIGLYCERIDES: 55 mg/dL (ref 0–149)
VLDL CHOLESTEROL CAL: 11 mg/dL (ref 5–40)

## 2017-09-23 LAB — HIV ANTIBODY (ROUTINE TESTING W REFLEX): HIV Screen 4th Generation wRfx: NONREACTIVE

## 2017-09-23 LAB — HEPATITIS C ANTIBODY: Hep C Virus Ab: 0.1 s/co ratio (ref 0.0–0.9)

## 2017-09-23 LAB — TSH: TSH: 3.15 u[IU]/mL (ref 0.450–4.500)

## 2017-09-23 NOTE — Telephone Encounter (Signed)
Pt aware that labs are good.  

## 2017-11-02 ENCOUNTER — Inpatient Hospital Stay: Payer: BLUE CROSS/BLUE SHIELD | Admitting: Hematology and Oncology

## 2017-11-09 ENCOUNTER — Inpatient Hospital Stay: Payer: BLUE CROSS/BLUE SHIELD | Attending: Hematology and Oncology | Admitting: Hematology and Oncology

## 2017-11-09 ENCOUNTER — Telehealth: Payer: Self-pay | Admitting: Hematology and Oncology

## 2017-11-09 DIAGNOSIS — D0501 Lobular carcinoma in situ of right breast: Secondary | ICD-10-CM

## 2017-11-09 DIAGNOSIS — Z79810 Long term (current) use of selective estrogen receptor modulators (SERMs): Secondary | ICD-10-CM | POA: Insufficient documentation

## 2017-11-09 DIAGNOSIS — Z170 Estrogen receptor positive status [ER+]: Secondary | ICD-10-CM | POA: Diagnosis not present

## 2017-11-09 DIAGNOSIS — N951 Menopausal and female climacteric states: Secondary | ICD-10-CM | POA: Diagnosis not present

## 2017-11-09 MED ORDER — TAMOXIFEN CITRATE 20 MG PO TABS: 20.0000 mg | ORAL_TABLET | Freq: Every day | ORAL | 3 refills | Status: DC

## 2017-11-09 NOTE — Progress Notes (Signed)
Patient Care Team: Abran Richard, MD as PCP - General (Internal Medicine)  DIAGNOSIS:  Encounter Diagnosis  Name Primary?  . 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  She complains of hot flashes.  She also complains of muscle aches and cramps.  REVIEW OF SYSTEMS:   Constitutional: Denies fevers, chills or abnormal weight loss Eyes: Denies blurriness of vision Ears, nose, mouth, throat, and face: Denies mucositis or sore throat Respiratory: Denies cough, dyspnea or wheezes Cardiovascular: Denies palpitation, chest discomfort Gastrointestinal:  Denies nausea, heartburn or change in bowel habits Skin: Denies abnormal skin rashes Lymphatics: Denies new lymphadenopathy or easy bruising Neurological:Denies numbness, tingling or new weaknesses Behavioral/Psych: Mood is stable, no new changes  Extremities: No lower extremity edema Breast:  denies any pain or lumps or nodules in either breasts All other systems were reviewed with the patient and are negative.  I have reviewed the past medical history, past surgical history, social history and family history with the patient and they are unchanged from previous note.  ALLERGIES:  is allergic to diphenhydramine; hctz  [hydrochlorothiazide]; lisinopril; other; and tuberculin tests.  MEDICATIONS:  Current Outpatient Medications  Medication Sig Dispense Refill  . albuterol (PROVENTIL HFA;VENTOLIN HFA) 108 (90 Base) MCG/ACT inhaler Inhale 2 puffs into the lungs every 4 (four) hours as needed for wheezing or shortness of breath.    Marland Kitchen amLODipine (NORVASC) 10 MG tablet Take 10 mg by mouth daily.    . Ascorbic Acid (VITAMIN C) 1000 MG tablet Take 1,000 mg by mouth daily.    Marland Kitchen aspirin 81 MG tablet Take 81 mg by mouth daily.    . B Complex Vitamins (VITAMIN B COMPLEX PO) Take 1,000 mg by mouth daily.     . cetirizine (ZYRTEC) 10 MG tablet Take 10 mg by mouth daily.    . COD LIVER OIL PO Take 1 capsule by mouth daily.     . Coenzyme Q10 (COQ-10 PO) Take 1 capsule by mouth daily.     . fluticasone (FLONASE) 50 MCG/ACT nasal spray Place 2 sprays into both nostrils daily.    . furosemide (LASIX) 40 MG tablet Take 40 mg by mouth daily.     . naproxen (NAPROSYN) 500 MG tablet Take 500 mg by mouth daily.    . pravastatin (PRAVACHOL) 20 MG tablet Take 20 mg by mouth daily.    Marland Kitchen spironolactone (ALDACTONE) 50 MG tablet Take 50 mg by mouth daily.    . tamoxifen (NOLVADEX) 20 MG tablet Take 1 tablet (20 mg total) by mouth daily. 90 tablet 3  . valACYclovir (VALTREX) 1000 MG tablet TAKE 1 TABLET(1000 MG) BY MOUTH DAILY 30 tablet 6   No current facility-administered medications for this visit.     PHYSICAL EXAMINATION: ECOG  PERFORMANCE STATUS: 1 - Symptomatic but completely ambulatory  Vitals:   11/09/17 1111  BP: 128/64  Pulse: 98  Resp: 16  Temp: 98.1 F (36.7 C)  SpO2: 99%   Filed Weights   11/09/17 1111  Weight: 233 lb 12.8 oz (106.1 kg)    GENERAL:alert, no distress and comfortable SKIN: skin color, texture, turgor are normal, no rashes or significant lesions EYES: normal, Conjunctiva are pink and non-injected, sclera clear OROPHARYNX:no exudate, no erythema and lips, buccal mucosa, and tongue normal    NECK: supple, thyroid normal size, non-tender, without nodularity LYMPH:  no palpable lymphadenopathy in the cervical, axillary or inguinal LUNGS: clear to auscultation and percussion with normal breathing effort HEART: regular rate & rhythm and no murmurs and no lower extremity edema ABDOMEN:abdomen soft, non-tender and normal bowel sounds MUSCULOSKELETAL:no cyanosis of digits and no clubbing  NEURO: alert & oriented x 3 with fluent speech, no focal motor/sensory deficits EXTREMITIES: No lower extremity edema BREAST: No palpable masses or nodules in either right or left breasts. No palpable axillary supraclavicular or infraclavicular adenopathy no breast tenderness or nipple discharge. (exam performed in the presence of a chaperone)  LABORATORY DATA:  I have reviewed the data as listed CMP Latest Ref Rng & Units 09/22/2017 01/13/2016 04/03/2010  Glucose 65 - 99 mg/dL 99 83 87  BUN 6 - 24 mg/dL 13 16 6   Creatinine 0.57 - 1.00 mg/dL 0.69 0.59 0.7  Sodium 134 - 144 mmol/L 141 138 138  Potassium 3.5 - 5.2 mmol/L 4.5 3.7 3.3(L)  Chloride 96 - 106 mmol/L 105 105 104  CO2 20 - 29 mmol/L 24 26 -  Calcium 8.7 - 10.2 mg/dL 9.3 8.7(L) -  Total Protein 6.0 - 8.5 g/dL 6.7 - -  Total Bilirubin 0.0 - 1.2 mg/dL 0.3 - -  Alkaline Phos 39 - 117 IU/L 50 - -  AST 0 - 40 IU/L 20 - -  ALT 0 - 32 IU/L 18 - -    Lab Results  Component Value Date   WBC 5.5 09/22/2017   HGB 12.5 09/22/2017   HCT 38.3 09/22/2017   MCV 82 09/22/2017   PLT 224 09/22/2017   NEUTROABS 4.6 04/03/2010    ASSESSMENT & PLAN:  Neoplasm of right breast, primary tumor staging category Tis: lobular carcinoma in situ (LCIS) Right breast biopsy 12/03/2015 upper outer quadrant: 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 Tamoxifen 20 mg daily Started September 2017. 2. Recommended annual mammograms  and breast exams.  3. Nonpharmacological measures including exercise,weight loss,reducing red meat intake  Tamoxifen Toxicities: 1. Severe hot flashes  Return to clinic in 1 yr.      No orders of the defined types were placed in this encounter.  The patient has a good understanding of the overall plan. she agrees with it. she will call with any problems that may develop before the next visit here.   Harriette Ohara, MD 11/09/17

## 2017-11-09 NOTE — Assessment & Plan Note (Signed)
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 Tamoxifen 20 mg daily Started September 2017. 2. Recommended annual mammograms and breast exams.  3. Nonpharmacological measures including exercise,weight loss,reducing red meat intake  Tamoxifen Toxicities: 1. Severe hot flashes  Return to clinic in 1 yr.

## 2017-11-09 NOTE — Telephone Encounter (Signed)
Gave patient AVs and calendar of upcoming June 2020 appointments.

## 2017-12-08 ENCOUNTER — Other Ambulatory Visit (HOSPITAL_COMMUNITY): Payer: Self-pay | Admitting: Internal Medicine

## 2017-12-08 DIAGNOSIS — Z9889 Other specified postprocedural states: Secondary | ICD-10-CM

## 2017-12-14 ENCOUNTER — Encounter (HOSPITAL_COMMUNITY): Payer: Self-pay

## 2017-12-14 ENCOUNTER — Ambulatory Visit (HOSPITAL_COMMUNITY)
Admission: RE | Admit: 2017-12-14 | Discharge: 2017-12-14 | Disposition: A | Discharge status: Home / Self Care | Payer: BLUE CROSS/BLUE SHIELD | Source: Ambulatory Visit | Attending: Internal Medicine | Admitting: Internal Medicine

## 2017-12-14 DIAGNOSIS — Z9889 Other specified postprocedural states: Secondary | ICD-10-CM | POA: Insufficient documentation

## 2018-03-08 ENCOUNTER — Other Ambulatory Visit: Payer: Self-pay | Admitting: Adult Health

## 2018-09-22 ENCOUNTER — Other Ambulatory Visit: Payer: BLUE CROSS/BLUE SHIELD | Admitting: Adult Health

## 2018-10-12 ENCOUNTER — Other Ambulatory Visit: Payer: Self-pay | Admitting: Adult Health

## 2018-11-08 ENCOUNTER — Telehealth: Payer: Self-pay | Admitting: Hematology and Oncology

## 2018-11-08 NOTE — Assessment & Plan Note (Signed)
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 Tamoxifen 20 mg daily Started September 2017. 2. Recommended annual mammograms and breast exams.  3. Nonpharmacological measures including exercise,weight loss,reducing red meat intake  Tamoxifen Toxicities: 1. Severe hot flashes I renewed the prescription for tamoxifen.  Breast cancer surveillance: 12/14/2017: Benign, breast density category B  Return to clinic in1 yr.

## 2018-11-08 NOTE — Telephone Encounter (Signed)
Talk with patient and she requested a phone visit

## 2018-11-14 ENCOUNTER — Telehealth: Payer: Self-pay | Admitting: Hematology and Oncology

## 2018-11-14 NOTE — Progress Notes (Incomplete)
°  HEMATOLOGY-ONCOLOGY TELEPHONE VISIT PROGRESS NOTE  I connected with Catherine Hansen on 11/14/2018 at 11:30 AM EDT by telephone and verified that I am speaking with the correct person using two identifiers.  I discussed the limitations, risks, security and privacy concerns of performing an evaluation and management service by telephone and the availability of in person appointments.  I also discussed with the patient that there may be a patient responsible charge related to this service. The patient expressed understanding and agreed to proceed.   History of Present Illness: Catherine Hansen is a 58 y.o. female with above-mentioned history of right breast LCIS treated with lumpectomy and who is currently on tamoxifen risk reduction therapy. I last saw her a year ago. Her most recent mammogram on 12/14/17 showed no evidence of malignancy bilaterally. She is over the phone today for annual follow-up.     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    I discussed the assessment and treatment plan with the patient. The patient was provided an opportunity to ask questions and all were answered. The patient agreed with the plan and demonstrated an understanding of the instructions. The patient was advised to call back or seek an in-person evaluation if the symptoms worsen or if the condition fails to improve as anticipated.   I provided *** minutes of non-face-to-face time during this encounter.   Rulon Eisenmenger, MD 11/14/2018    Julious Oka Dorshimer, am acting as scribe for Nicholas Lose, MD.  {Add scribe attestation statement}

## 2018-11-14 NOTE — Telephone Encounter (Signed)
Contacted pt to verify telephone visit for pre reg °

## 2018-11-15 ENCOUNTER — Inpatient Hospital Stay: Payer: BLUE CROSS/BLUE SHIELD | Attending: Hematology and Oncology | Admitting: Hematology and Oncology

## 2018-11-15 DIAGNOSIS — M79606 Pain in leg, unspecified: Secondary | ICD-10-CM | POA: Insufficient documentation

## 2018-11-15 DIAGNOSIS — N951 Menopausal and female climacteric states: Secondary | ICD-10-CM | POA: Insufficient documentation

## 2018-11-15 DIAGNOSIS — D0501 Lobular carcinoma in situ of right breast: Secondary | ICD-10-CM | POA: Insufficient documentation

## 2018-11-16 ENCOUNTER — Telehealth: Payer: Self-pay | Admitting: *Deleted

## 2018-11-16 NOTE — Telephone Encounter (Signed)

## 2018-11-17 ENCOUNTER — Other Ambulatory Visit (HOSPITAL_COMMUNITY)
Admission: RE | Admit: 2018-11-17 | Discharge: 2018-11-17 | Disposition: A | Discharge status: Home / Self Care | Payer: BLUE CROSS/BLUE SHIELD | Source: Ambulatory Visit | Attending: Adult Health | Admitting: Adult Health

## 2018-11-17 ENCOUNTER — Other Ambulatory Visit: Payer: Self-pay

## 2018-11-17 ENCOUNTER — Ambulatory Visit (INDEPENDENT_AMBULATORY_CARE_PROVIDER_SITE_OTHER): Payer: BLUE CROSS/BLUE SHIELD | Admitting: Adult Health

## 2018-11-17 ENCOUNTER — Encounter: Payer: Self-pay | Admitting: Adult Health

## 2018-11-17 VITALS — BP 136/79 | HR 77 | Ht 68.0 in | Wt 228.5 lb

## 2018-11-17 DIAGNOSIS — Z1212 Encounter for screening for malignant neoplasm of rectum: Secondary | ICD-10-CM

## 2018-11-17 DIAGNOSIS — B369 Superficial mycosis, unspecified: Secondary | ICD-10-CM

## 2018-11-17 DIAGNOSIS — Z1211 Encounter for screening for malignant neoplasm of colon: Secondary | ICD-10-CM | POA: Diagnosis not present

## 2018-11-17 DIAGNOSIS — Z01419 Encounter for gynecological examination (general) (routine) without abnormal findings: Secondary | ICD-10-CM | POA: Diagnosis not present

## 2018-11-17 LAB — HEMOCCULT GUIAC POC 1CARD (OFFICE): Fecal Occult Blood, POC: NEGATIVE

## 2018-11-17 MED ORDER — VALACYCLOVIR HCL 1 G PO TABS: ORAL_TABLET | ORAL | 12 refills | Status: DC

## 2018-11-17 MED ORDER — NYSTATIN-TRIAMCINOLONE 100000-0.1 UNIT/GM-% EX CREA: 1.0000 "application " | TOPICAL_CREAM | Freq: Two times a day (BID) | CUTANEOUS | 1 refills | Status: DC

## 2018-11-17 MED ORDER — NAPROXEN 500 MG PO TABS: 500.0000 mg | ORAL_TABLET | Freq: Every day | ORAL | 6 refills | Status: DC

## 2018-11-17 NOTE — Progress Notes (Addendum)
Patient ID: Catherine Hansen, female   DOB: 16-Mar-1961, 58 y.o.   MRN: [id number] History of Present Illness: Ailee is a 58 year old black female, separated, PM, in for a well woman gyn exam and pap. PCP is Dr Yong Channel.    Current Medications, Allergies, Past Medical History, Past Surgical History, Family History and Social History were reviewed in [employer] record.     Review of Systems: Patient denies any headaches, hearing loss, fatigue, blurred vision, shortness of breath, chest pain, abdominal pain, problems with bowel movements, urination, or intercourse. No joint pain(takes 1 naprosyn daily for body aches) or mood swings. She has irritation/rash in right groin area for several days, this panties may have chafed     Physical Exam:BP 136/79 (BP Location: Left Arm, Patient Position: Sitting, Cuff Size: Large)   Pulse 77   Ht 5\' 8"  (1.727 m)   Wt 228 lb 8 oz (103.6 kg)   LMP 02/08/2013 (Exact Date)   BMI 34.74 kg/m  General:  Well developed, well nourished, no acute distress Skin:  Warm and dry Neck:  Midline trachea, normal thyroid, good ROM, no lymphadenopathy Lungs; Clear to auscultation bilaterally Breast:  No dominant palpable mass, retraction, or nipple discharge Cardiovascular: Regular rate and rhythm Abdomen:  Soft, non tender, no hepatosplenomegaly Pelvic:  External genitalia is normal in appearance, but has some redness and linear lines in right groin area.  The vagina is normal in appearance. Urethra has no lesions or masses. The cervix is smooth, pap with HPV performed.  Uterus is felt to be normal size, shape, and contour.  No adnexal masses or tenderness noted.Bladder is non tender, no masses felt. Rectal: Good sphincter tone, no polyps, or hemorrhoids felt.  Hemoccult negative. Extremities/musculoskeletal:  No swelling or varicosities noted, no clubbing or cyanosis Psych:  No mood changes, alert and cooperative,seems happy Fall risk is low PHQ  9 score is 4, denies being suicidal.  Examination chaperoned by Levy Pupa LPN.  She requests refills on valtrex and naprosyn.   Impression and Plan: 1. Encounter for gynecological examination with Papanicolaou smear of cervix Pap sent, will follow up in 1 year for physical  - Cytology - PAP( [place]) Mammogram in July Colonoscopy per GI 2028  2. Screening for colorectal cancer - POCT occult blood stool  3. Superficial fungus infection of skin -keep clean and dry, leave panties off at home Will Rx mytrex cream Meds ordered this encounter  Medications  . nystatin-triamcinolone (MYCOLOG II) cream    Sig: Apply 1 application topically 2 (two) times daily.    Dispense:  30 g    Refill:  1    Order Specific Question:   Supervising Provider    Answer:   EURE, LUTHER H [[id number]]  . valACYclovir (VALTREX) 1000 MG tablet    Sig: Take 1 daily    Dispense:  30 tablet    Refill:  12    Order Specific Question:   Supervising Provider    Answer:   Elonda Husky, LUTHER H [[id number]]  . naproxen (NAPROSYN) 500 MG tablet    Sig: Take 1 tablet (500 mg total) by mouth daily.    Dispense:  30 tablet    Refill:  6    Order Specific Question:   Supervising Provider    Answer:   Tania Ade H [[id number]]

## 2018-11-21 LAB — CYTOLOGY - PAP
Chlamydia: NEGATIVE
Diagnosis: NEGATIVE
HPV: NOT DETECTED
Neisseria Gonorrhea: NEGATIVE

## 2018-11-23 NOTE — Progress Notes (Signed)
Patient Care Team: Abran Richard, MD as PCP - General (Internal Medicine)  DIAGNOSIS:    ICD-10-CM   1. 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 with above-mentioned history of right breast LCIS treated with lumpectomy who is on tamoxifen for risk reduction therapy. I last saw her a year ago. Her most recent mammogram on 12/14/17 showed no evidence of malignancy bilaterally. She presents to the clinic today for annual follow-up.  She denies any lumps or nodules in the breast. Tamoxifen continues to cause hot flashes. She is also complaining of leg pains. She has not lost any significant weight.  REVIEW OF SYSTEMS:   Constitutional: Denies fevers, chills or abnormal weight loss Eyes: Denies blurriness of vision Ears, nose, mouth, throat, and face: Denies mucositis or sore throat Respiratory: Denies cough, dyspnea or wheezes Cardiovascular: Denies palpitation, chest discomfort Gastrointestinal: Denies nausea, heartburn or change in bowel habits Skin: Denies abnormal skin rashes Lymphatics: Denies new lymphadenopathy or easy bruising Neurological: Denies numbness, tingling or new weaknesses Behavioral/Psych: Mood is stable, no new changes  Extremities: No lower extremity edema Breast: denies any pain or lumps or nodules in either breasts All other systems were reviewed with the patient  and are negative.  I have reviewed the past medical history, past surgical history, social history and family history with the patient and they are unchanged from previous note.  ALLERGIES:  is allergic to diphenhydramine; hctz [hydrochlorothiazide]; lisinopril; other; and tuberculin tests.  MEDICATIONS:  Current Outpatient Medications  Medication Sig Dispense Refill  . albuterol (PROVENTIL HFA;VENTOLIN HFA) 108 (90 Base) MCG/ACT inhaler Inhale 2 puffs into the lungs every 4 (four) hours as needed for wheezing or shortness of breath.    Marland Kitchen amLODipine (NORVASC) 10 MG tablet Take 10 mg by mouth daily.    . Ascorbic Acid (VITAMIN C) 1000 MG tablet Take 1,000 mg by mouth daily.    Marland Kitchen aspirin 81 MG tablet Take 81 mg by mouth daily.    . B Complex Vitamins (VITAMIN B COMPLEX PO) Take 1,000 mg by mouth daily.     . cetirizine (ZYRTEC) 10 MG tablet Take 10 mg by mouth daily.    . COD LIVER OIL PO Take 1 capsule by mouth daily.     . Coenzyme Q10 (COQ-10 PO) Take 1 capsule by mouth daily.     . fluticasone (FLONASE) 50 MCG/ACT nasal spray Place 2 sprays into both nostrils daily.    . furosemide (LASIX) 40 MG tablet Take 40 mg by mouth daily.     . naproxen (NAPROSYN) 500 MG tablet Take 1 tablet (500 mg total) by mouth daily. 30 tablet 6  . nystatin-triamcinolone (MYCOLOG II) cream Apply 1 application topically 2 (two) times daily. 30 g 1  . pravastatin (PRAVACHOL) 20 MG tablet Take 20 mg by mouth daily.    Marland Kitchen spironolactone (ALDACTONE) 50 MG tablet Take 50 mg by mouth  daily.    . tamoxifen (NOLVADEX) 20 MG tablet Take 1 tablet (20 mg total) by mouth daily. 90 tablet 3  . valACYclovir (VALTREX) 1000 MG tablet Take 1 daily 30 tablet 12   No current facility-administered medications for this visit.     PHYSICAL EXAMINATION: ECOG PERFORMANCE STATUS: 1 - Symptomatic but completely ambulatory  Vitals:   11/24/18 1301  BP: 132/73  Pulse: (!) 101  Resp: 18  Temp: 98.5 F (36.9 C)  SpO2: 99%    Filed Weights   11/24/18 1301  Weight: 232 lb 3.2 oz (105.3 kg)    GENERAL: alert, no distress and comfortable SKIN: skin color, texture, turgor are normal, no rashes or significant lesions EYES: normal, Conjunctiva are pink and non-injected, sclera clear OROPHARYNX: no exudate, no erythema and lips, buccal mucosa, and tongue normal  NECK: supple, thyroid normal size, non-tender, without nodularity LYMPH: no palpable lymphadenopathy in the cervical, axillary or inguinal LUNGS: clear to auscultation and percussion with normal breathing effort HEART: regular rate & rhythm and no murmurs and no lower extremity edema ABDOMEN: abdomen soft, non-tender and normal bowel sounds MUSCULOSKELETAL: no cyanosis of digits and no clubbing  NEURO: alert & oriented x 3 with fluent speech, no focal motor/sensory deficits EXTREMITIES: No lower extremity edema BREAST: No palpable masses or nodules in either right or left breasts. No palpable axillary supraclavicular or infraclavicular adenopathy no breast tenderness or nipple discharge. (exam performed in the presence of a chaperone)  LABORATORY DATA:  I have reviewed the data as listed CMP Latest Ref Rng & Units 09/22/2017 01/13/2016 04/03/2010  Glucose 65 - 99 mg/dL 99 83 87  BUN 6 - 24 mg/dL 13 16 6   Creatinine 0.57 - 1.00 mg/dL 0.69 0.59 0.7  Sodium 134 - 144 mmol/L 141 138 138  Potassium 3.5 - 5.2 mmol/L 4.5 3.7 3.3(L)  Chloride 96 - 106 mmol/L 105 105 104  CO2 20 - 29 mmol/L 24 26 -  Calcium 8.7 - 10.2 mg/dL 9.3 8.7(L) -  Total Protein 6.0 - 8.5 g/dL 6.7 - -  Total Bilirubin 0.0 - 1.2 mg/dL 0.3 - -  Alkaline Phos 39 - 117 IU/L 50 - -  AST 0 - 40 IU/L 20 - -  ALT 0 - 32 IU/L 18 - -    Lab Results  Component Value Date   WBC 5.5 09/22/2017   HGB 12.5 09/22/2017   HCT 38.3 09/22/2017   MCV 82 09/22/2017   PLT 224 09/22/2017   NEUTROABS 4.6 04/03/2010    ASSESSMENT & PLAN:  Neoplasm of right breast, primary tumor staging category Tis:  lobular carcinoma in situ (LCIS) Right breast biopsy 12/03/2015 upper outer quadrant: 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 Tamoxifen 20 mg daily Started September 2017. 2. Nonpharmacological measures including exercise,weight loss,reducing red meat intake  Surveillance: Mammogram 12/14/2017: Benign, breast density category B.    Tamoxifen Toxicities: 1. Severe hot flashes I renewed her prescription for tamoxifen. Return to clinic in1 yr.    No orders of the defined types were placed in this encounter.  The patient has a good understanding of the overall plan. she agrees with it. she will call with any problems that may develop before the next visit here.  Nicholas Lose, MD 11/24/2018  Julious Oka Dorshimer am acting as scribe for Dr. Nicholas Lose.  I have reviewed the above documentation for accuracy and completeness,  and I agree with the above.

## 2018-11-24 ENCOUNTER — Other Ambulatory Visit: Payer: Self-pay

## 2018-11-24 ENCOUNTER — Inpatient Hospital Stay: Payer: BLUE CROSS/BLUE SHIELD | Admitting: Hematology and Oncology

## 2018-11-24 DIAGNOSIS — N951 Menopausal and female climacteric states: Secondary | ICD-10-CM

## 2018-11-24 DIAGNOSIS — M79606 Pain in leg, unspecified: Secondary | ICD-10-CM

## 2018-11-24 DIAGNOSIS — D0501 Lobular carcinoma in situ of right breast: Secondary | ICD-10-CM

## 2018-11-24 MED ORDER — TAMOXIFEN CITRATE 20 MG PO TABS: 20.0000 mg | ORAL_TABLET | Freq: Every day | ORAL | 3 refills | Status: DC

## 2018-11-24 NOTE — Assessment & Plan Note (Signed)
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 Tamoxifen 20 mg daily Started September 2017. 2. Nonpharmacological measures including exercise,weight loss,reducing red meat intake  Surveillance: Mammogram 12/14/2017: Benign, breast density category B.    Tamoxifen Toxicities: 1. Severe hot flashes  Return to clinic in1 yr.

## 2018-12-13 ENCOUNTER — Other Ambulatory Visit (HOSPITAL_COMMUNITY): Payer: Self-pay | Admitting: Internal Medicine

## 2018-12-13 DIAGNOSIS — D0501 Lobular carcinoma in situ of right breast: Secondary | ICD-10-CM

## 2018-12-26 ENCOUNTER — Other Ambulatory Visit (HOSPITAL_COMMUNITY): Payer: Self-pay | Admitting: Internal Medicine

## 2019-01-03 ENCOUNTER — Ambulatory Visit (HOSPITAL_COMMUNITY): Admission: RE | Admit: 2019-01-03 | Payer: BLUE CROSS/BLUE SHIELD | Source: Ambulatory Visit

## 2019-01-03 ENCOUNTER — Ambulatory Visit (HOSPITAL_COMMUNITY): Payer: BLUE CROSS/BLUE SHIELD

## 2019-01-17 ENCOUNTER — Other Ambulatory Visit: Payer: Self-pay

## 2019-01-17 ENCOUNTER — Ambulatory Visit (HOSPITAL_COMMUNITY): Admission: RE | Admit: 2019-01-17 | Payer: BLUE CROSS/BLUE SHIELD | Source: Ambulatory Visit

## 2019-01-17 ENCOUNTER — Ambulatory Visit (HOSPITAL_COMMUNITY): Payer: BLUE CROSS/BLUE SHIELD

## 2019-01-17 ENCOUNTER — Ambulatory Visit (HOSPITAL_COMMUNITY)
Admission: RE | Admit: 2019-01-17 | Discharge: 2019-01-17 | Disposition: A | Discharge status: Home / Self Care | Payer: BLUE CROSS/BLUE SHIELD | Source: Ambulatory Visit | Attending: Internal Medicine | Admitting: Internal Medicine

## 2019-01-17 DIAGNOSIS — D0501 Lobular carcinoma in situ of right breast: Secondary | ICD-10-CM | POA: Insufficient documentation

## 2019-05-11 ENCOUNTER — Other Ambulatory Visit: Payer: Self-pay | Admitting: Adult Health

## 2019-11-08 ENCOUNTER — Telehealth: Payer: Self-pay | Admitting: Hematology and Oncology

## 2019-11-08 NOTE — Telephone Encounter (Signed)
Rescheduled appt on 6/18 to 6/22. Provider on pal. Pt aware of appt.

## 2019-11-22 ENCOUNTER — Ambulatory Visit (INDEPENDENT_AMBULATORY_CARE_PROVIDER_SITE_OTHER): Payer: BLUE CROSS/BLUE SHIELD | Admitting: Adult Health

## 2019-11-22 ENCOUNTER — Encounter: Payer: Self-pay | Admitting: Adult Health

## 2019-11-22 VITALS — BP 131/78 | HR 98 | Ht 68.5 in | Wt 228.0 lb

## 2019-11-22 DIAGNOSIS — Z01419 Encounter for gynecological examination (general) (routine) without abnormal findings: Secondary | ICD-10-CM

## 2019-11-22 DIAGNOSIS — Z853 Personal history of malignant neoplasm of breast: Secondary | ICD-10-CM

## 2019-11-22 DIAGNOSIS — Z1211 Encounter for screening for malignant neoplasm of colon: Secondary | ICD-10-CM

## 2019-11-22 LAB — HEMOCCULT GUIAC POC 1CARD (OFFICE): Fecal Occult Blood, POC: NEGATIVE

## 2019-11-22 NOTE — Progress Notes (Signed)
Patient ID: LEXIANA SPINDEL, female   DOB: 24-Jul-1960, 59 y.o.   MRN: [id number] History of Present Illness: Kamisha is a 59 year old black female,separated, PM in for a well woman gyn exam.She had a normal pap with negative HPV 11/17/2018. She has had chest CT recently, she used to work at [employer], ?scarring. PCP Is Dr Yong Channel.   Current Medications, Allergies, Past Medical History, Past Surgical History, Family History and Social History were reviewed in [employer] record.     Review of Systems: Patient denies any headaches, hearing loss, fatigue, blurred vision, shortness of breath, chest pain, abdominal pain, problems with bowel movements, urination, or intercourse(no sex). No joint pain or mood swings.   Physical Exam:BP 131/78 (BP Location: Left Arm, Patient Position: Sitting, Cuff Size: Normal)   Pulse 98   Ht 5' 8.5" (1.74 m)   Wt 228 lb (103.4 kg)   LMP 02/08/2013 (Exact Date)   BMI 34.16 kg/m  General:  Well developed, well nourished, no acute distress Skin:  Warm and dry Neck:  Midline trachea, normal thyroid, good ROM, no lymphadenopathy,no carotid bruits heard Lungs; Clear to auscultation bilaterally Breast:  No dominant palpable mass, retraction, or nipple discharge Cardiovascular: Regular rate and rhythm Abdomen:  Soft, non tender, no hepatosplenomegaly Pelvic:  External genitalia is normal in appearance, no lesions.  The vagina is pale with loss of moisture and rugae. Urethra has no lesions or masses. The cervix is smooth.  Uterus is felt to be normal size, shape, and contour.  No adnexal masses or tenderness noted.Bladder is non tender, no masses felt. Rectal: Good sphincter tone, no polyps, or hemorrhoids felt.  Hemoccult negative. Extremities/musculoskeletal:  No swelling or varicosities noted, no clubbing or cyanosis Psych:  No mood changes, alert and cooperative,seems happy AA 0 Fall risk is low PHQ 9 scor eis 0. Examination  chaperoned by Gabriel Cirri hall LPN  Impression and Plan: 1. Encounter for well woman exam with routine gynecological exam Physical in 1 year Pap in 2023 Labs with PCP Mammogram yearly, due in August   2. Screening for colorectal cancer Colonoscopy per GI  3. History of breast cancer Has follow up next week with oncologist

## 2019-11-24 ENCOUNTER — Ambulatory Visit: Payer: BLUE CROSS/BLUE SHIELD | Admitting: Hematology and Oncology

## 2019-11-27 NOTE — Progress Notes (Signed)
Patient Care Team: Abran Richard, MD as PCP - General (Internal Medicine)  DIAGNOSIS:    ICD-10-CM   1. 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 with above-mentioned history of right breast LCIS treated with lumpectomy who is on tamoxifen for risk reduction therapy. I last saw her a year ago. Mammogram on 01/17/19 showed no evidence of malignancy bilaterally. She presents to the clinic today for annual follow-up.  She denies any side effects to tamoxifen therapy.  ALLERGIES:  is allergic to diphenhydramine, hctz [hydrochlorothiazide], lisinopril, other, and tuberculin tests.  MEDICATIONS:  Current Outpatient Medications  Medication Sig Dispense Refill  . albuterol (PROVENTIL HFA;VENTOLIN HFA) 108 (90 Base) MCG/ACT inhaler Inhale 2 puffs into the lungs every 4 (four) hours as needed for wheezing or shortness of breath.    Marland Kitchen amLODipine (NORVASC) 10 MG tablet Take 10 mg by mouth daily.    . Ascorbic Acid (VITAMIN C) 1000 MG tablet Take 1,000 mg by mouth daily.    Marland Kitchen aspirin 81 MG tablet Take 81 mg by mouth daily.    . B Complex Vitamins (VITAMIN B COMPLEX PO) Take 1,000 mg by mouth daily.     . cetirizine (ZYRTEC) 10 MG tablet Take 10 mg by mouth daily.    . COD LIVER OIL PO Take 1 capsule by mouth daily.     . Coenzyme Q10 (COQ-10 PO) Take 1 capsule by mouth daily.       Marland Kitchen ELDERBERRY PO Take by mouth.    . fluticasone (FLONASE) 50 MCG/ACT nasal spray Place 2 sprays into both nostrils daily.    . furosemide (LASIX) 40 MG tablet Take 40 mg by mouth daily.     . naproxen (NAPROSYN) 500 MG tablet Take 1 tablet (500 mg total) by mouth daily. 30 tablet 6  . pravastatin (PRAVACHOL) 20 MG tablet Take 20 mg by mouth daily.    Marland Kitchen spironolactone (ALDACTONE) 50 MG tablet Take 50 mg by mouth daily.    . tamoxifen (NOLVADEX) 20 MG tablet Take 1 tablet (20 mg total) by mouth daily. 90 tablet 3  . valACYclovir (VALTREX) 1000 MG tablet TAKE 1 TABLET(1000 MG) BY MOUTH DAILY 30 tablet 12   No current facility-administered medications for this visit.    PHYSICAL EXAMINATION: ECOG PERFORMANCE STATUS: 1 - Symptomatic but completely ambulatory  Vitals:   11/28/19 1038  BP: 136/74  Pulse: 76  Resp: 18  Temp: 98.7 F (37.1 C)  SpO2: 100%   Filed Weights   11/28/19 1038  Weight: 226 lb 1.6 oz (102.6 kg)    BREAST: No palpable masses or nodules in either right or left breasts. No palpable axillary supraclavicular or infraclavicular adenopathy no breast tenderness or nipple discharge. (exam performed in the presence of a chaperone)  LABORATORY DATA:  I have reviewed the data as listed CMP Latest Ref Rng & Units 09/22/2017 01/13/2016  04/03/2010  Glucose 65 - 99 mg/dL 99 83 87  BUN 6 - 24 mg/dL 13 16 6   Creatinine 0.57 - 1.00 mg/dL 0.69 0.59 0.7  Sodium 134 - 144 mmol/L 141 138 138  Potassium 3.5 - 5.2 mmol/L 4.5 3.7 3.3(L)  Chloride 96 - 106 mmol/L 105 105 104  CO2 20 - 29 mmol/L 24 26 -  Calcium 8.7 - 10.2 mg/dL 9.3 8.7(L) -  Total Protein 6.0 - 8.5 g/dL 6.7 - -  Total Bilirubin 0.0 - 1.2 mg/dL 0.3 - -  Alkaline Phos 39 - 117 IU/L 50 - -  AST 0 - 40 IU/L 20 - -  ALT 0 - 32 IU/L 18 - -    Lab Results  Component Value Date   WBC 5.5 09/22/2017   HGB 12.5 09/22/2017   HCT 38.3 09/22/2017   MCV 82 09/22/2017   PLT 224 09/22/2017   NEUTROABS 4.6 04/03/2010     ASSESSMENT & PLAN:  Neoplasm of right breast, primary tumor staging category Tis: lobular carcinoma in situ (LCIS) Right breast biopsy 12/03/2015 upper outer quadrant: 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 Tamoxifen 20 mg daily Started September 2017. 2. Nonpharmacological measures including exercise,weight loss,reducing red meat intake  Surveillance:  Mammogram 01/17/2019: Benign, breast density category B.  Breast exam 11/28/2019: Benign  Tamoxifen Toxicities: 1. Severe hot flashes I renewed her prescription for tamoxifen. Return to clinic in1 yr.    No orders of the defined types were placed in this encounter.  The patient has a good understanding of the overall plan. she agrees with it. she will call with any problems that may develop before the next visit here.  Total time spent: 20 mins including face to face time and time spent for planning, charting and coordination of care  Nicholas Lose, MD 11/28/2019  I, Cloyde Reams Dorshimer, am acting as scribe for Dr. Nicholas Lose.  I have reviewed the above documentation for accuracy and completeness, and I agree with the above.

## 2019-11-28 ENCOUNTER — Other Ambulatory Visit: Payer: Self-pay

## 2019-11-28 ENCOUNTER — Inpatient Hospital Stay: Payer: BLUE CROSS/BLUE SHIELD | Attending: Hematology and Oncology | Admitting: Hematology and Oncology

## 2019-11-28 DIAGNOSIS — D0501 Lobular carcinoma in situ of right breast: Secondary | ICD-10-CM | POA: Insufficient documentation

## 2019-11-28 DIAGNOSIS — N951 Menopausal and female climacteric states: Secondary | ICD-10-CM | POA: Insufficient documentation

## 2019-11-28 MED ORDER — TAMOXIFEN CITRATE 20 MG PO TABS: 20.0000 mg | ORAL_TABLET | Freq: Every day | ORAL | 3 refills | Status: DC

## 2019-11-28 NOTE — Assessment & Plan Note (Signed)
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 Tamoxifen 20 mg daily Started September 2017. 2. Nonpharmacological measures including exercise,weight loss,reducing red meat intake  Surveillance:  Mammogram 01/17/2019: Benign, breast density category B.  Breast exam 11/28/2019: Benign  Tamoxifen Toxicities: 1. Severe hot flashes I renewed her prescription for tamoxifen. Return to clinic in1 yr.

## 2019-12-07 ENCOUNTER — Other Ambulatory Visit: Payer: Self-pay | Admitting: Adult Health

## 2019-12-13 ENCOUNTER — Emergency Department (HOSPITAL_COMMUNITY)
Admission: EM | Admit: 2019-12-13 | Discharge: 2019-12-13 | Disposition: A | Discharge status: Home / Self Care | Payer: BLUE CROSS/BLUE SHIELD | Attending: Emergency Medicine | Admitting: Emergency Medicine

## 2019-12-13 ENCOUNTER — Encounter (HOSPITAL_COMMUNITY): Payer: Self-pay | Admitting: Emergency Medicine

## 2019-12-13 ENCOUNTER — Emergency Department (HOSPITAL_COMMUNITY): Payer: BLUE CROSS/BLUE SHIELD

## 2019-12-13 ENCOUNTER — Other Ambulatory Visit: Payer: Self-pay

## 2019-12-13 DIAGNOSIS — Z853 Personal history of malignant neoplasm of breast: Secondary | ICD-10-CM | POA: Insufficient documentation

## 2019-12-13 DIAGNOSIS — I10 Essential (primary) hypertension: Secondary | ICD-10-CM | POA: Insufficient documentation

## 2019-12-13 DIAGNOSIS — S069X9A Unspecified intracranial injury with loss of consciousness of unspecified duration, initial encounter: Secondary | ICD-10-CM | POA: Diagnosis present

## 2019-12-13 DIAGNOSIS — J45909 Unspecified asthma, uncomplicated: Secondary | ICD-10-CM | POA: Insufficient documentation

## 2019-12-13 DIAGNOSIS — Y9269 Other specified industrial and construction area as the place of occurrence of the external cause: Secondary | ICD-10-CM | POA: Insufficient documentation

## 2019-12-13 DIAGNOSIS — R55 Syncope and collapse: Secondary | ICD-10-CM | POA: Insufficient documentation

## 2019-12-13 DIAGNOSIS — Z79899 Other long term (current) drug therapy: Secondary | ICD-10-CM | POA: Insufficient documentation

## 2019-12-13 DIAGNOSIS — Y998 Other external cause status: Secondary | ICD-10-CM | POA: Diagnosis not present

## 2019-12-13 DIAGNOSIS — Z7951 Long term (current) use of inhaled steroids: Secondary | ICD-10-CM | POA: Insufficient documentation

## 2019-12-13 DIAGNOSIS — W133XXA Fall through floor, initial encounter: Secondary | ICD-10-CM | POA: Diagnosis not present

## 2019-12-13 DIAGNOSIS — R42 Dizziness and giddiness: Secondary | ICD-10-CM | POA: Diagnosis not present

## 2019-12-13 DIAGNOSIS — Y9389 Activity, other specified: Secondary | ICD-10-CM | POA: Insufficient documentation

## 2019-12-13 LAB — BASIC METABOLIC PANEL
Anion gap: 11 (ref 5–15)
BUN: 14 mg/dL (ref 6–20)
CO2: 25 mmol/L (ref 22–32)
Calcium: 9.5 mg/dL (ref 8.9–10.3)
Chloride: 104 mmol/L (ref 98–111)
Creatinine, Ser: 0.77 mg/dL (ref 0.44–1.00)
GFR calc Af Amer: >60 mL/min (ref >60)
GFR calc non Af Amer: >60 mL/min (ref >60)
Glucose, Bld: 89 mg/dL (ref 70–99)
Potassium: 3.8 mmol/L (ref 3.5–5.1)
Sodium: 140 mmol/L (ref 135–145)

## 2019-12-13 LAB — URINALYSIS, ROUTINE W REFLEX MICROSCOPIC
Bacteria, UA: NONE SEEN
Bilirubin Urine: NEGATIVE
Glucose, UA: NEGATIVE mg/dL
Hgb urine dipstick: NEGATIVE
Ketones, ur: NEGATIVE mg/dL
Nitrite: NEGATIVE
Protein, ur: NEGATIVE mg/dL
Specific Gravity, Urine: 1.016 (ref 1.005–1.030)
pH: 5 (ref 5.0–8.0)

## 2019-12-13 LAB — CBG MONITORING, ED: Glucose-Capillary: 103 mg/dL — ABNORMAL HIGH (ref 70–99)

## 2019-12-13 LAB — CBC
HCT: 42.6 % (ref 36.0–46.0)
Hemoglobin: 13.1 g/dL (ref 12.0–15.0)
MCH: 26.8 pg (ref 26.0–34.0)
MCHC: 30.8 g/dL (ref 30.0–36.0)
MCV: 87.1 fL (ref 80.0–100.0)
Platelets: 266 10*3/uL (ref 150–400)
RBC: 4.89 MIL/uL (ref 3.87–5.11)
RDW: 13.5 % (ref 11.5–15.5)
WBC: 6.9 10*3/uL (ref 4.0–10.5)
nRBC: 0 % (ref 0.0–0.2)

## 2019-12-13 MED ORDER — SODIUM CHLORIDE 0.9 % IV BOLUS
1000.0000 mL | Freq: Once | INTRAVENOUS | Status: AC
  Administered 2019-12-13: 1000 mL via INTRAVENOUS

## 2019-12-13 NOTE — ED Provider Notes (Signed)
[facility] Provider Note   CSN: [id number] Arrival date & time: 12/13/19  1739     History Chief Complaint  Patient presents with  . Loss of Consciousness    Catherine Hansen is a 59 y.o. female.  She is brought in by EMS for evaluation after she passed out at the store.  She said she had finished paying for her stuff and then felt hot all over and then lightheaded and then fell and hit the floor striking her head.  She said she still has a little bit of a headache and she feels generally weak.  She said she has not passed out since over 30 years ago.  No recent illness.  Taking her meds regularly.  Eating and drinking well.  Not on blood thinners.  History of breast cancer and is status post resection and is on tamoxifen.  Has not seen any bleeding.  The history is provided by the patient and the EMS personnel.  Loss of Consciousness Episode history:  Single Most recent episode:  Today Timing:  Rare Progression:  Improving Chronicity:  New Context: normal activity   Witnessed: yes   Relieved by:  Lying down Worsened by:  Nothing Ineffective treatments:  None tried Associated symptoms: headaches, malaise/fatigue and weakness (generalized)   Associated symptoms: no chest pain, no difficulty breathing, no fever, no recent fall, no recent injury, no recent surgery, no rectal bleeding, no seizures, no shortness of breath and no visual change   Risk factors: no seizures        Past Medical History:  Diagnosis Date  . Asthma   . Breast cancer ([place]) 12/09/2015   Pathology showed pleomorphic lobular carcinoma in situ with comedonecrosis and calcifications   . Breast disorder    LCIS right  . Flu 08/27/2015  . Hematuria 12/25/2014  . Herpes simplex without mention of complication   . History of herpes simplex infection 08/08/2013  . Hypertension   . Migraine   . Pelvic pain in female 12/25/2014  . Vaginal Pap smear, abnormal     Patient Active Problem List    Diagnosis Date Noted  . Screening for colorectal cancer 09/21/2017  . Encounter for well woman exam with routine gynecological exam 09/21/2017  . Screening cholesterol level 09/21/2017  . Screening for thyroid disorder 09/21/2017  . Need for hepatitis C screening test 09/21/2017  . Screening examination for STD (sexually transmitted disease) 09/21/2017  . Special screening for malignant neoplasms, colon   . History of breast cancer 09/15/2016  . Neoplasm of right breast, primary tumor staging category Tis: lobular carcinoma in situ (LCIS) 12/09/2015  . Flu 08/27/2015  . Pelvic pain in female 12/25/2014  . Hematuria 12/25/2014  . History of herpes simplex infection 08/08/2013    Past Surgical History:  Procedure Laterality Date  . BREAST BIOPSY Right   . BREAST LUMPECTOMY Right   . BREAST LUMPECTOMY WITH RADIOACTIVE SEED LOCALIZATION Left 01/15/2016   Procedure: BREAST LUMPECTOMY WITH RADIOACTIVE SEED LOCALIZATION;  Surgeon: Donnie Mesa, MD;  Location: [facility];  Service: General;  Laterality: Left;  . cervix frozen     cells on cervix frozen  . CESAREAN SECTION    . COLONOSCOPY N/A 10/26/2016   Procedure: COLONOSCOPY;  Surgeon: Danie Binder, MD;  [facility];  Service: Endoscopy;  Laterality: N/A;  12:00 pm     OB History    Gravida  3   Para  3   Term  Preterm      AB      Living  2     SAB      TAB      Ectopic      Multiple      Live Births  3           Family History  Problem Relation Age of Onset  . COPD Mother   . Cancer Mother        uterus,lung  . Diabetes Mother   . Hypertension Mother   . Heart disease Mother   . Early death Father   . Kidney disease Father   . Hypertension Father   . Hypertension Sister   . Hypertension Brother   . Cancer Maternal Aunt   . Stroke Maternal Grandfather   . Stroke Paternal Grandmother   . Stroke Paternal Grandfather   . Hypertension Sister   . Hypertension Sister     . Early death Sister   . Heart failure Sister   . Kidney disease Maternal Aunt   . Heart disease Maternal Aunt     Social History   Tobacco Use  . Smoking status: Never Smoker  . Smokeless tobacco: Never Used  Vaping Use  . Vaping Use: Never used  Substance Use Topics  . Alcohol use: No  . Drug use: No    Home Medications Prior to Admission medications   Medication Sig Start Date End Date Taking? Authorizing Provider  albuterol (PROVENTIL HFA;VENTOLIN HFA) 108 (90 Base) MCG/ACT inhaler Inhale 2 puffs into the lungs every 4 (four) hours as needed for wheezing or shortness of breath.    [provider]  amLODipine (NORVASC) 10 MG tablet Take 10 mg by mouth daily.    [provider]  Ascorbic Acid (VITAMIN C) 1000 MG tablet Take 1,000 mg by mouth daily.    [provider]  aspirin 81 MG tablet Take 81 mg by mouth daily.    [provider]  B Complex Vitamins (VITAMIN B COMPLEX PO) Take 1,000 mg by mouth daily.     [provider]  cetirizine (ZYRTEC) 10 MG tablet Take 10 mg by mouth daily.    [provider]  COD LIVER OIL PO Take 1 capsule by mouth daily.     [provider]  Coenzyme Q10 (COQ-10 PO) Take 1 capsule by mouth daily.     [provider]  ELDERBERRY PO Take by mouth.    [provider]  fluticasone (FLONASE) 50 MCG/ACT nasal spray Place 2 sprays into both nostrils daily.    [provider]  furosemide (LASIX) 40 MG tablet Take 40 mg by mouth daily.     [provider]  naproxen (NAPROSYN) 500 MG tablet Take 1 tablet (500 mg total) by mouth daily. 11/17/18   Estill Dooms, NP  pravastatin (PRAVACHOL) 20 MG tablet Take 20 mg by mouth daily.    [provider]  spironolactone (ALDACTONE) 50 MG tablet Take 50 mg by mouth daily.    [provider]  tamoxifen (NOLVADEX) 20 MG tablet Take 1 tablet (20 mg total) by mouth daily. 11/28/19   Nicholas Lose, MD   valACYclovir (VALTREX) 1000 MG tablet TAKE 1 TABLET BY MOUTH DAILY 12/07/19   Derrek Monaco A, NP    Allergies    Diphenhydramine, Hctz [hydrochlorothiazide], Lisinopril, Other, and Tuberculin tests  Review of Systems   Review of Systems  Constitutional: Positive for malaise/fatigue. Negative for fever.  HENT: Negative  for sore throat.   Eyes: Negative for visual disturbance.  Respiratory: Negative for shortness of breath.   Cardiovascular: Positive for syncope. Negative for chest pain.  Gastrointestinal: Negative for abdominal pain.  Genitourinary: Negative for dysuria.  Musculoskeletal: Negative for neck pain.  Skin: Negative for rash.  Neurological: Positive for weakness (generalized) and headaches. Negative for seizures.    Physical Exam Updated Vital Signs BP 136/62 (BP Location: Right Arm)   Pulse 77   Temp 98.5 F (36.9 C)   Resp 16   Ht 5' 8.5" (1.74 m)   Wt 102 kg   LMP 02/08/2013 (Exact Date)   SpO2 100%   BMI 33.69 kg/m   Physical Exam Vitals and nursing note reviewed.  Constitutional:      General: She is not in acute distress.    Appearance: Normal appearance. She is well-developed.  HENT:     Head: Normocephalic and atraumatic.  Eyes:     Conjunctiva/sclera: Conjunctivae normal.  Cardiovascular:     Rate and Rhythm: Normal rate and regular rhythm.     Heart sounds: No murmur heard.   Pulmonary:     Effort: Pulmonary effort is normal. No respiratory distress.     Breath sounds: Normal breath sounds.  Abdominal:     Palpations: Abdomen is soft.     Tenderness: There is no abdominal tenderness.  Musculoskeletal:        General: No deformity. Normal range of motion.     Cervical back: Normal range of motion and neck supple.  Skin:    General: Skin is warm and dry.     Capillary Refill: Capillary refill takes less than 2 seconds.  Neurological:     General: No focal deficit present.     Mental Status: She is alert and oriented to person,  place, and time.     Sensory: No sensory deficit.     Motor: No weakness.     ED Results / Procedures / Treatments   Labs (all labs ordered are listed, but only abnormal results are displayed) Labs Reviewed  URINALYSIS, ROUTINE W REFLEX MICROSCOPIC - Abnormal; Notable for the following components:      Result Value   APPearance HAZY (*)    Leukocytes,Ua SMALL (*)    Non Squamous Epithelial 0-5 (*)    All other components within normal limits  CBG MONITORING, ED - Abnormal; Notable for the following components:   Glucose-Capillary 103 (*)    All other components within normal limits  BASIC METABOLIC PANEL  CBC    EKG EKG Interpretation  Date/Time:  Wednesday December 13 2019 17:52:01 EDT Ventricular Rate:  76 PR Interval:  180 QRS Duration: 70 QT Interval:  382 QTC Calculation: 429 R Axis:   29 Text Interpretation: Normal sinus rhythm Normal ECG No significant change since prior 8/17 Confirmed by Aletta Edouard [phone]) on 12/13/2019 5:59:37 PM   Radiology CT Head Wo Contrast  Result Date: 12/13/2019 CLINICAL DATA:  Headache, posttraumatic. Additional history provided: Patient reports "passing out" earlier today while shopping, hitting head. EXAM: CT HEAD WITHOUT CONTRAST TECHNIQUE: Contiguous axial images were obtained from the base of the skull through the vertex without intravenous contrast. COMPARISON:  No pertinent prior studies available for comparison. FINDINGS: Brain: Mild generalized parenchymal atrophy. Mild ill-defined hypoattenuation within the cerebral white matter is nonspecific, but consistent with chronic small vessel ischemic disease. Chronic lacunar infarct within the left caudate head (series 2, image 17). There is no acute intracranial hemorrhage. No demarcated  cortical infarct. No extra-axial fluid collection. No evidence of intracranial mass. No midline shift. Vascular: No hyperdense vessel. Skull: Normal. Negative for fracture or focal lesion. Sinuses/Orbits:  Visualized orbits show no acute finding. No significant paranasal sinus disease or mastoid effusion at the imaged levels. IMPRESSION: No evidence of acute intracranial abnormality. Mild generalized parenchymal atrophy and chronic small vessel ischemic disease. Chronic lacunar infarct within the left caudate head. Electronically Signed   By: Kellie Simmering DO   On: 12/13/2019 18:46    Procedures Procedures (including critical care time)  Medications Ordered in ED Medications  sodium chloride 0.9 % bolus 1,000 mL (0 mLs Intravenous Stopped 12/13/19 2240)    ED Course  I have reviewed the triage vital signs and the nursing notes.  Pertinent labs & imaging results that were available during my care of the patient were reviewed by me and considered in my medical decision making (see chart for details).  Clinical Course as of Dec 13 1108  Wed Dec 13, 2019  2023 Patient lab work-up fairly unremarkable.  We checked orthostatics and although her heart rate did not change much she became tachycardic and symptomatic.  Have ordered some IV fluids.   [MB]  2226 Patient ambulated here in the department well without any significant tachycardia.  Will discharge.  Return instructions discussed.   [MB]    Clinical Course User Index [MB] Hayden Rasmussen, MD   MDM Rules/Calculators/A&P                         This patient complains of syncope; this involves an extensive number of treatment Options and is a complaint that carries with it a high risk of complications and Morbidity. The differential includes vasovagal, hypovolemia, arrhythmia, metabolic disturbance, seizure, bleed  I ordered, reviewed and interpreted labs, which included CBC with normal white count normal hemoglobin, chemistries normal including normal BUN and creatinine, urinalysis without obvious signs of infection I ordered medication IV fluids I ordered imaging studies which included head CT and I independently    visualized and  interpreted imaging which showed no acute findings Previous records obtained and reviewed in epic, no recent ED visits.  After the interventions stated above, I reevaluated the patient and found patient to be feeling improved. She is ambulated in the department without any recurrence of her symptoms. She is comfortable with discharge and understands to follow-up with her doctors. Return instructions discussed   Final Clinical Impression(s) / ED Diagnoses Final diagnoses:  Syncope and collapse  Orthostatic dizziness    Rx / DC Orders ED Discharge Orders    None       Hayden Rasmussen, MD 12/14/19 1112

## 2019-12-13 NOTE — ED Notes (Signed)
Pt ambulated at this time pt stayed at 61 percent while walking no chest discomfort at this time HR 99-101 no issues stated by the pt returned to room without incident. Catherine Hansen

## 2019-12-13 NOTE — ED Notes (Signed)
Orthostatic vital signs complete, pt reports some dizziness when going from sitting to standing and increased dizziness when going to standing, pt unable to complete 3 min standing.

## 2019-12-13 NOTE — ED Triage Notes (Signed)
Per EMS, pt was at [employer] and began to feel warm while standing in line to check out. Pt reports "I went out." per EMS, pt was alert and oriented at time of there arrival. Pt reports was out for a "few minutes". Pt complains of right upper arm pain. Able to follow commands. Denies being on blood thinners.

## 2019-12-13 NOTE — Discharge Instructions (Addendum)
You were seen in the emergency department for evaluation of a fainting spell.  Your lab work did not show any serious findings.  CAT scan of your head also looked okay.  You were lightheaded when standing so were given IV fluids with improvement in your symptoms.  Please try to stay well-hydrated.  Contact your doctor for close follow-up.  Return to the emergency department for any worsening or concerning symptoms.

## 2019-12-13 NOTE — ED Notes (Signed)
Pt in bed, pt reports headache, pt states that she doesn't need anything for pain at this time.  Call bell within reach

## 2020-01-16 ENCOUNTER — Telehealth: Payer: Self-pay | Admitting: Hematology and Oncology

## 2020-01-16 NOTE — Telephone Encounter (Signed)
Rescheduled appointment per 8/5 provider message. Patient is aware of updated appointment date and time.

## 2020-02-13 ENCOUNTER — Other Ambulatory Visit (HOSPITAL_COMMUNITY): Payer: Self-pay | Admitting: Internal Medicine

## 2020-02-13 DIAGNOSIS — Z9889 Other specified postprocedural states: Secondary | ICD-10-CM

## 2020-03-05 ENCOUNTER — Other Ambulatory Visit: Payer: Self-pay

## 2020-03-05 ENCOUNTER — Ambulatory Visit (HOSPITAL_COMMUNITY)
Admission: RE | Admit: 2020-03-05 | Discharge: 2020-03-05 | Disposition: A | Discharge status: Home / Self Care | Payer: BLUE CROSS/BLUE SHIELD | Source: Ambulatory Visit | Attending: Internal Medicine | Admitting: Internal Medicine

## 2020-03-05 DIAGNOSIS — Z9889 Other specified postprocedural states: Secondary | ICD-10-CM

## 2020-06-04 ENCOUNTER — Telehealth: Payer: Self-pay

## 2020-06-04 ENCOUNTER — Telehealth: Payer: Self-pay | Admitting: *Deleted

## 2020-06-04 NOTE — Telephone Encounter (Signed)
Per chart review this has already been handled by oncology.

## 2020-06-04 NOTE — Telephone Encounter (Signed)
Received call from pt requesting bra prescription form be faxed to [facility] in [place].  Script and recent office notes successfully faxed to [phone].

## 2020-06-04 NOTE — Telephone Encounter (Signed)
Pt wanting an Rx sent to the [facility] shop for her Breast surgery.    [facility] [employer]  Fax: [phone] Phone: [phone]

## 2020-11-01 ENCOUNTER — Other Ambulatory Visit: Payer: Self-pay | Admitting: Hematology and Oncology

## 2020-11-27 ENCOUNTER — Ambulatory Visit: Payer: BLUE CROSS/BLUE SHIELD | Admitting: Hematology and Oncology

## 2020-11-27 NOTE — Progress Notes (Signed)
Patient Care Team: Abran Richard, MD as PCP - General (Internal Medicine)  DIAGNOSIS:    ICD-10-CM   1. Neoplasm of right breast, primary tumor staging category Tis: lobular carcinoma in situ (LCIS)  D05.01       SUMMARY OF ONCOLOGIC HISTORY: Oncology History  Neoplasm of right breast, primary tumor staging category Tis: lobular carcinoma in situ (LCIS)  12/03/2015 Initial Diagnosis   Right breast biopsy upper outer quadrant: Pleomorphic lobular carcinoma in situ with comedonecrosis and calcifications, grade 2-3    01/15/2016 Surgery   Right lumpectomy: Pleomorphic LCIS 0.9 cm    02/27/2016 -  Anti-estrogen oral therapy   Tamoxifen 20 mg daily      CHIEF COMPLIANT: Follow-up of right breast LCIS on tamoxifen therapy  INTERVAL HISTORY: Catherine Hansen is a 60 y.o. with above-mentioned history of right breast LCIS treated with lumpectomy who is on tamoxifen for risk reduction therapy. I last saw her a year ago. Mammogram on 03/05/20 showed no evidence of malignancy bilaterally. She presents to the clinic today for annual follow-up.  She complains of joint stiffness and achiness and hot flashes.  She denies any lumps or nodules in the breast.  ALLERGIES:  is allergic to diphenhydramine, hctz [hydrochlorothiazide], lisinopril, other, and tuberculin tests.  MEDICATIONS:  Current Outpatient Medications  Medication Sig Dispense Refill   metoprolol tartrate (LOPRESSOR) 25 MG tablet Take 1 tablet (25 mg total) by mouth daily.     albuterol (PROVENTIL HFA;VENTOLIN HFA) 108 (90 Base) MCG/ACT inhaler Inhale 2 puffs into the lungs every 4 (four) hours as needed for wheezing or shortness of breath.     Ascorbic Acid (VITAMIN C) 1000 MG tablet Take 1,000 mg by mouth daily.     aspirin 81 MG tablet Take 81 mg by mouth daily.     B Complex Vitamins (VITAMIN B COMPLEX PO) Take 1,000 mg by mouth daily.      cetirizine (ZYRTEC) 10 MG tablet Take 10 mg by mouth daily.     COD LIVER OIL PO Take 1  capsule by mouth daily.      Coenzyme Q10 (COQ-10 PO) Take 1 capsule by mouth daily.      ELDERBERRY PO Take 1 tablet by mouth daily.      fluticasone (FLONASE) 50 MCG/ACT nasal spray Place 2 sprays into both nostrils daily.     furosemide (LASIX) 40 MG tablet Take 40 mg by mouth daily.      naproxen (NAPROSYN) 500 MG tablet Take 1 tablet (500 mg total) by mouth daily. 30 tablet 6   pravastatin (PRAVACHOL) 20 MG tablet Take 20 mg by mouth daily.     spironolactone (ALDACTONE) 50 MG tablet Take 50 mg by mouth daily.     valACYclovir (VALTREX) 1000 MG tablet TAKE 1 TABLET BY MOUTH DAILY (Patient taking differently: Take 1,000 mg by mouth daily. ) 30 tablet 12   No current facility-administered medications for this visit.    PHYSICAL EXAMINATION: ECOG PERFORMANCE STATUS: 1 - Symptomatic but completely ambulatory  Vitals:   11/28/20 1031  BP: 124/70  Pulse: 86  Resp: 18  Temp: 97.6 F (36.4 C)  SpO2: 100%   Filed Weights   11/28/20 1031  Weight: 247 lb 3.2 oz (112.1 kg)      LABORATORY DATA:  I have reviewed the data as listed CMP Latest Ref Rng & Units 12/13/2019 09/22/2017 01/13/2016  Glucose 70 - 99 mg/dL 89 99 83  BUN 6 - 20 mg/dL 14 13  16  Creatinine 0.44 - 1.00 mg/dL 0.77 0.69 0.59  Sodium 135 - 145 mmol/L 140 141 138  Potassium 3.5 - 5.1 mmol/L 3.8 4.5 3.7  Chloride 98 - 111 mmol/L 104 105 105  CO2 22 - 32 mmol/L 25 24 26   Calcium 8.9 - 10.3 mg/dL 9.5 9.3 8.7(L)  Total Protein 6.0 - 8.5 g/dL - 6.7 -  Total Bilirubin 0.0 - 1.2 mg/dL - 0.3 -  Alkaline Phos 39 - 117 IU/L - 50 -  AST 0 - 40 IU/L - 20 -  ALT 0 - 32 IU/L - 18 -    Lab Results  Component Value Date   WBC 6.9 12/13/2019   HGB 13.1 12/13/2019   HCT 42.6 12/13/2019   MCV 87.1 12/13/2019   PLT 266 12/13/2019   NEUTROABS 4.6 04/03/2010    ASSESSMENT & PLAN:  Neoplasm of right breast, primary tumor staging category Tis: lobular carcinoma in situ (LCIS) Right breast biopsy 12/03/2015 upper outer quadrant:  Pleomorphic lobular carcinoma in situ with comedonecrosis and calcifications, grade 2-3  Right lumpectomy 01/15/2016: Pleomorphic LCIS 0.9 cm   Risk reduction strategies: 1. Tamoxifen 20 mg daily Started September 2017 completed September 2022 2. Nonpharmacological measures including exercise, weight loss, reducing red meat intake   Surveillance: Mammogram 03/05/20: Benign, breast density category B.  Breast exam 11/27/20: Benign   Tamoxifen Toxicities: 1. Severe hot flashes 2. joint stiffness   She completes 5 years of tamoxifen therapy at this time.  Once she finishes her current prescription she will discontinue it. Return to clinic on an as-needed basis.    No orders of the defined types were placed in this encounter.  The patient has a good understanding of the overall plan. she agrees with it. she will call with any problems that may develop before the next visit here.  Total time spent: 20 mins including face to face time and time spent for planning, charting and coordination of care  Rulon Eisenmenger, MD, MPH 11/28/2020  I, Thana Ates, am acting as scribe for Dr. Nicholas Lose.  I have reviewed the above documentation for accuracy and completeness, and I agree with the above.

## 2020-11-27 NOTE — Assessment & Plan Note (Signed)
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 Tamoxifen 20 mg daily Started September 2017. 2. Nonpharmacological measures including exercise,weight loss,reducing red meat intake  Surveillance: Mammogram 03/05/20: Benign, breast density category B.  Breast exam 11/27/20: Benign  Tamoxifen Toxicities: 1. Severe hot flashes I renewed her prescription for tamoxifen. Return to clinic in1 yr.

## 2020-11-28 ENCOUNTER — Other Ambulatory Visit: Payer: Self-pay

## 2020-11-28 ENCOUNTER — Inpatient Hospital Stay: Payer: BLUE CROSS/BLUE SHIELD | Attending: Hematology and Oncology | Admitting: Hematology and Oncology

## 2020-11-28 DIAGNOSIS — N951 Menopausal and female climacteric states: Secondary | ICD-10-CM | POA: Insufficient documentation

## 2020-11-28 DIAGNOSIS — D0501 Lobular carcinoma in situ of right breast: Secondary | ICD-10-CM | POA: Diagnosis not present

## 2020-11-28 DIAGNOSIS — M2560 Stiffness of unspecified joint, not elsewhere classified: Secondary | ICD-10-CM | POA: Insufficient documentation

## 2020-11-28 MED ORDER — METOPROLOL TARTRATE 25 MG PO TABS
25.0000 mg | ORAL_TABLET | Freq: Every day | ORAL | Status: DC
Start: 2020-11-28 — End: 2023-03-28

## 2020-12-11 ENCOUNTER — Other Ambulatory Visit: Payer: Self-pay | Admitting: Adult Health

## 2021-02-17 ENCOUNTER — Other Ambulatory Visit (HOSPITAL_COMMUNITY): Payer: Self-pay | Admitting: Internal Medicine

## 2021-02-17 DIAGNOSIS — Z1231 Encounter for screening mammogram for malignant neoplasm of breast: Secondary | ICD-10-CM

## 2021-03-12 ENCOUNTER — Ambulatory Visit (HOSPITAL_COMMUNITY): Payer: BLUE CROSS/BLUE SHIELD

## 2021-03-20 ENCOUNTER — Other Ambulatory Visit: Payer: Self-pay

## 2021-03-20 ENCOUNTER — Ambulatory Visit (HOSPITAL_COMMUNITY)
Admission: RE | Admit: 2021-03-20 | Discharge: 2021-03-20 | Disposition: A | Discharge status: Home / Self Care | Payer: BLUE CROSS/BLUE SHIELD | Source: Ambulatory Visit | Attending: Internal Medicine | Admitting: Internal Medicine

## 2021-03-20 DIAGNOSIS — Z1231 Encounter for screening mammogram for malignant neoplasm of breast: Secondary | ICD-10-CM | POA: Diagnosis present

## 2021-12-11 ENCOUNTER — Other Ambulatory Visit: Payer: Self-pay | Admitting: Adult Health

## 2022-03-10 ENCOUNTER — Other Ambulatory Visit (HOSPITAL_COMMUNITY): Payer: Self-pay | Admitting: Internal Medicine

## 2022-03-10 DIAGNOSIS — Z1231 Encounter for screening mammogram for malignant neoplasm of breast: Secondary | ICD-10-CM

## 2022-03-23 ENCOUNTER — Ambulatory Visit (HOSPITAL_COMMUNITY)
Admission: RE | Admit: 2022-03-23 | Discharge: 2022-03-23 | Disposition: A | Discharge status: Home / Self Care | Payer: BLUE CROSS/BLUE SHIELD | Source: Ambulatory Visit | Attending: Internal Medicine | Admitting: Internal Medicine

## 2022-03-23 DIAGNOSIS — Z1231 Encounter for screening mammogram for malignant neoplasm of breast: Secondary | ICD-10-CM | POA: Insufficient documentation

## 2022-04-22 ENCOUNTER — Telehealth: Payer: Self-pay | Admitting: Adult Health

## 2022-04-22 NOTE — Telephone Encounter (Signed)
Patient would like you to write a prescription for her to get bras from the bra shop. Please advise.

## 2022-06-20 IMAGING — MG MM DIGITAL SCREENING BILAT W/ TOMO AND CAD
8 series · 8 of 24 positions shown · non-contrast
Comparison: Previous exam(s).

CLINICAL DATA: Screening.

EXAM:
DIGITAL SCREENING BILATERAL MAMMOGRAM WITH TOMOSYNTHESIS AND CAD
TECHNIQUE: Bilateral screening digital craniocaudal and mediolateral oblique
mammograms were obtained. Bilateral screening digital breast
tomosynthesis was performed. The images were evaluated with
computer-aided detection.

[R CC synth-2D]
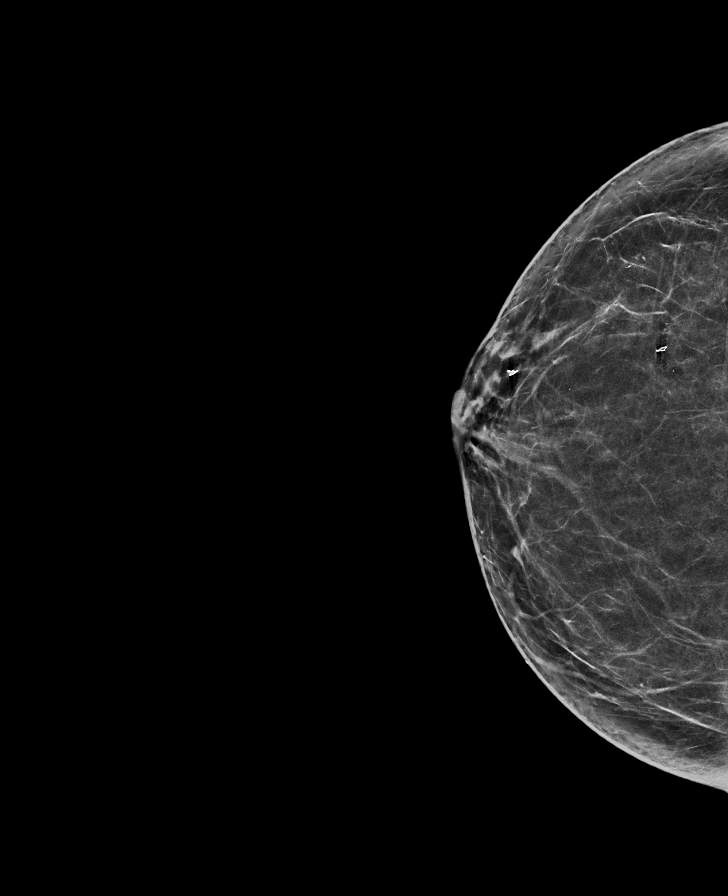

[L CC synth-2D]
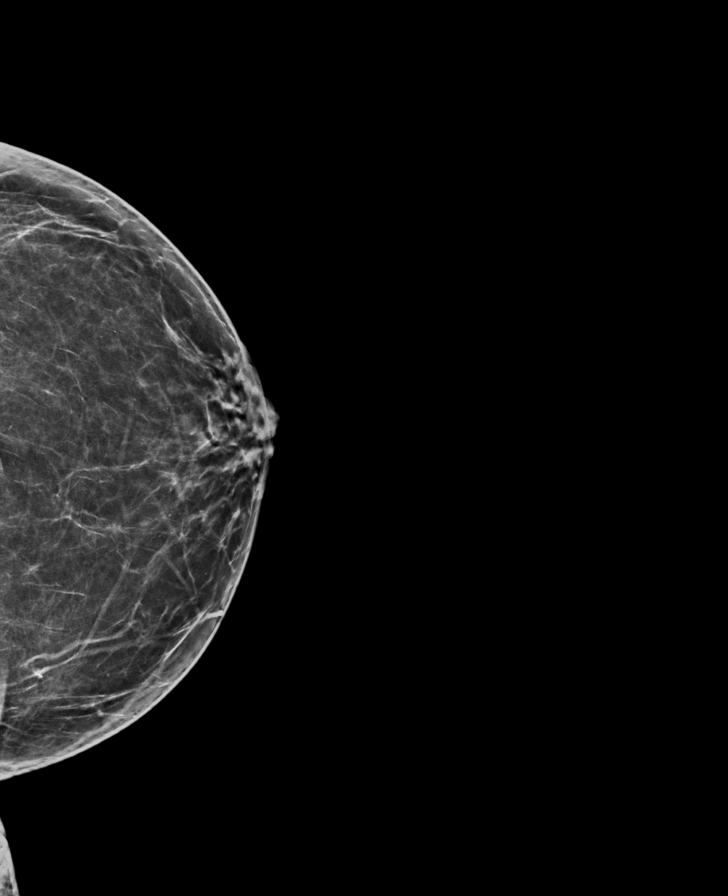

[L MLO synth-2D]
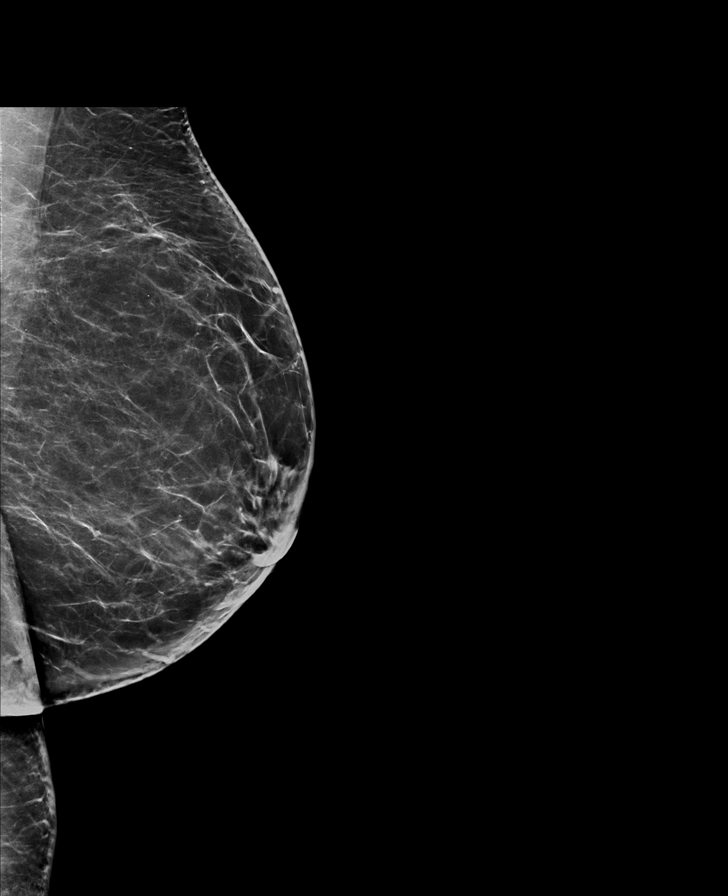

[R MLO synth-2D]
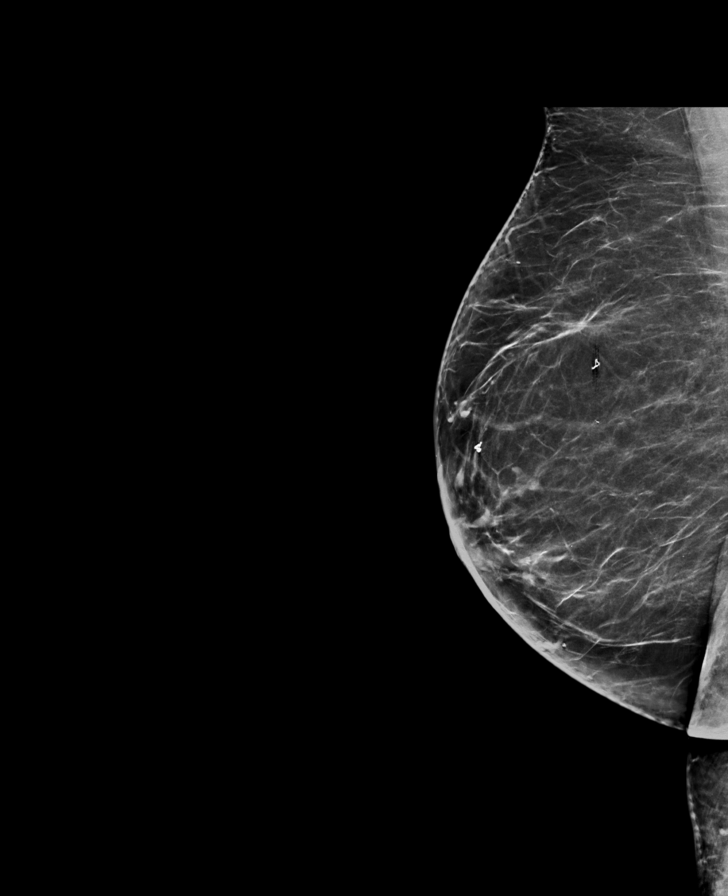

[L MLO tomo · tomo slice 39/77.0]
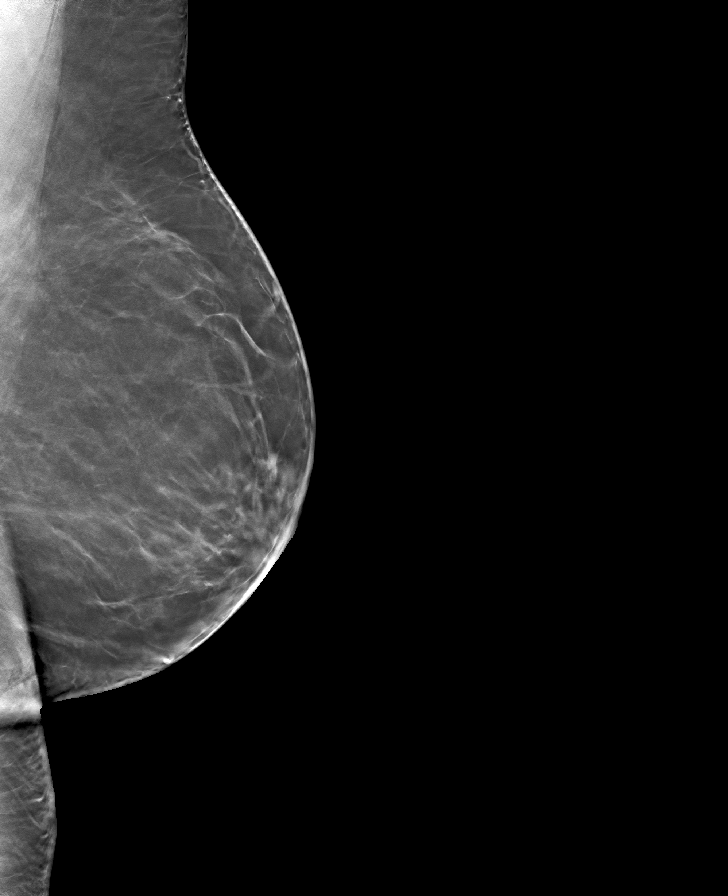

[R CC tomo · tomo slice 35/68.0]
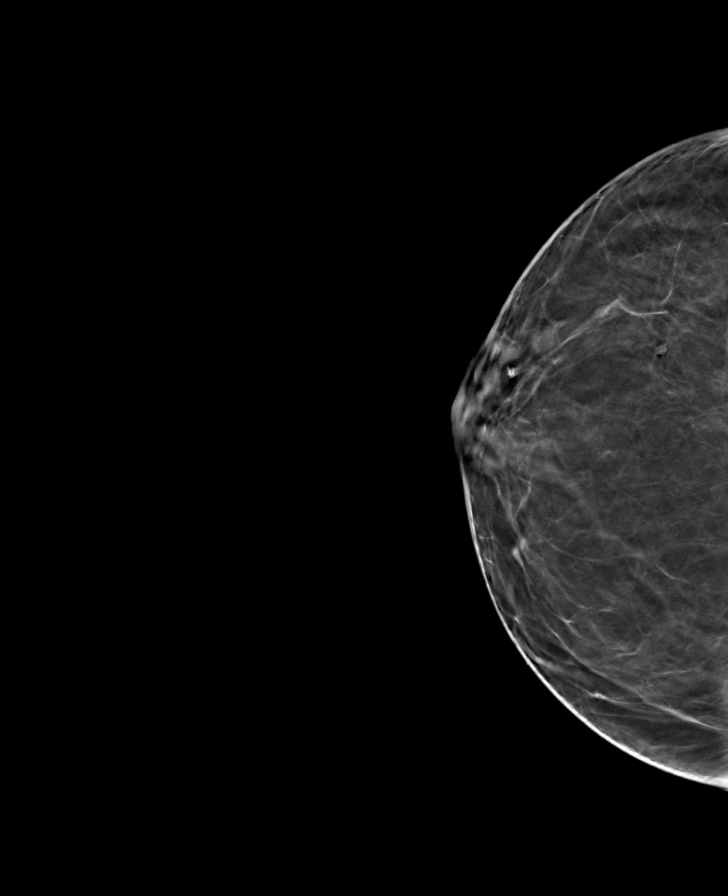

[L CC tomo · tomo slice 33/66.0]
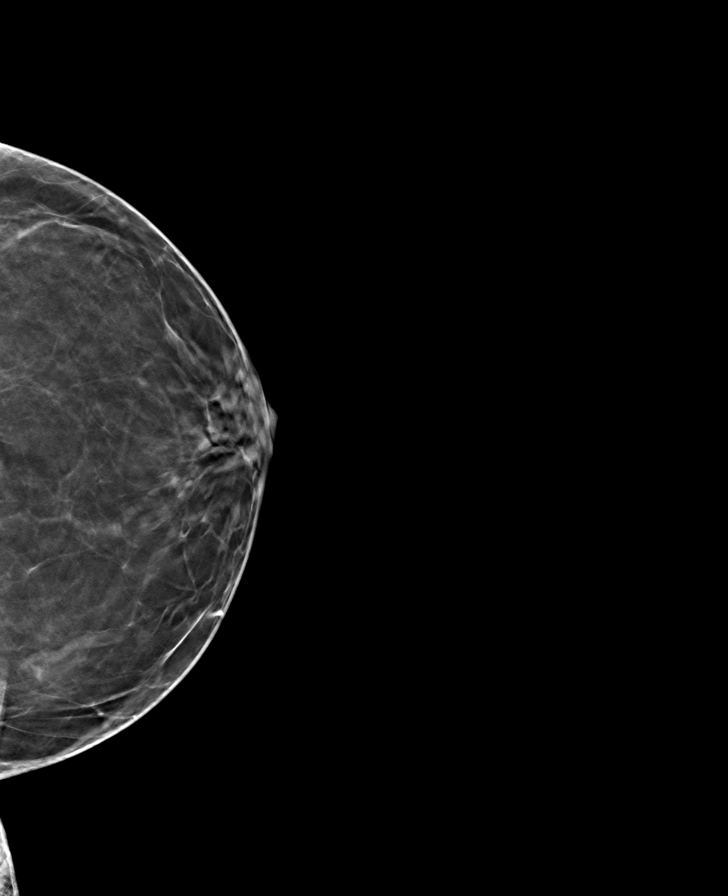

[R MLO tomo · tomo slice 38/75.0]
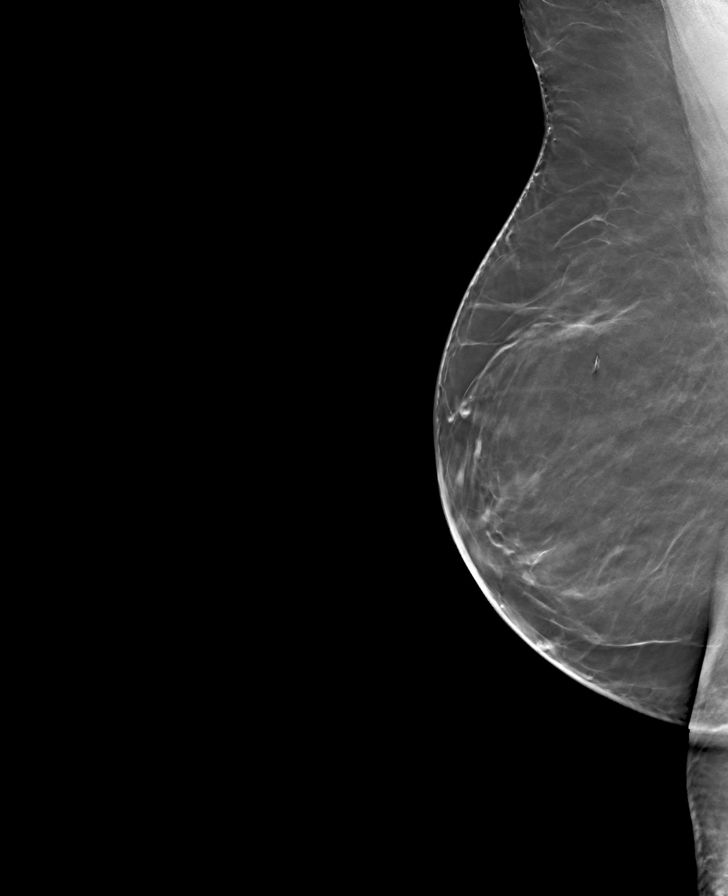

[8 of 24 positions shown; findings below may reference images not displayed]

ACR Breast Density Category b: There are scattered areas of
fibroglandular density.
FINDINGS: There are no findings suspicious for malignancy.
IMPRESSION: No mammographic evidence of malignancy. A result letter of this
screening mammogram will be mailed directly to the patient.

RECOMMENDATION:
Screening mammogram in one year. (Code:51-O-LD2)

BI-RADS CATEGORY  1: Negative.

## 2023-01-08 ENCOUNTER — Other Ambulatory Visit: Payer: Self-pay | Admitting: Adult Health

## 2023-01-18 ENCOUNTER — Ambulatory Visit: Payer: BLUE CROSS/BLUE SHIELD | Admitting: Adult Health

## 2023-03-22 ENCOUNTER — Other Ambulatory Visit (HOSPITAL_COMMUNITY): Payer: Self-pay | Admitting: Internal Medicine

## 2023-03-22 DIAGNOSIS — M79605 Pain in left leg: Secondary | ICD-10-CM

## 2023-03-28 ENCOUNTER — Emergency Department (HOSPITAL_COMMUNITY)
Admission: EM | Admit: 2023-03-28 | Discharge: 2023-03-28 | Disposition: A | Discharge status: Home / Self Care | Payer: BLUE CROSS/BLUE SHIELD | Attending: Emergency Medicine | Admitting: Emergency Medicine

## 2023-03-28 ENCOUNTER — Encounter (HOSPITAL_COMMUNITY): Payer: Self-pay

## 2023-03-28 ENCOUNTER — Other Ambulatory Visit: Payer: Self-pay

## 2023-03-28 ENCOUNTER — Emergency Department (HOSPITAL_COMMUNITY): Payer: BLUE CROSS/BLUE SHIELD

## 2023-03-28 DIAGNOSIS — Z7982 Long term (current) use of aspirin: Secondary | ICD-10-CM | POA: Diagnosis not present

## 2023-03-28 DIAGNOSIS — Z79899 Other long term (current) drug therapy: Secondary | ICD-10-CM | POA: Insufficient documentation

## 2023-03-28 DIAGNOSIS — M5432 Sciatica, left side: Secondary | ICD-10-CM | POA: Diagnosis not present

## 2023-03-28 DIAGNOSIS — I10 Essential (primary) hypertension: Secondary | ICD-10-CM | POA: Diagnosis present

## 2023-03-28 LAB — BASIC METABOLIC PANEL
Anion gap: 10 (ref 5–15)
BUN: 17 mg/dL (ref 8–23)
CO2: 22 mmol/L (ref 22–32)
Calcium: 9 mg/dL (ref 8.9–10.3)
Chloride: 104 mmol/L (ref 98–111)
Creatinine, Ser: 0.9 mg/dL (ref 0.44–1.00)
GFR, Estimated: >60 mL/min (ref >60)
Glucose, Bld: 107 mg/dL — ABNORMAL HIGH (ref 70–99)
Potassium: 3.7 mmol/L (ref 3.5–5.1)
Sodium: 136 mmol/L (ref 135–145)

## 2023-03-28 LAB — TROPONIN I (HIGH SENSITIVITY): Troponin I (High Sensitivity): 21 ng/L — ABNORMAL HIGH (ref <18)

## 2023-03-28 LAB — CBC
HCT: 38.8 % (ref 36.0–46.0)
Hemoglobin: 12.8 g/dL (ref 12.0–15.0)
MCH: 26.9 pg (ref 26.0–34.0)
MCHC: 33 g/dL (ref 30.0–36.0)
MCV: 81.7 fL (ref 80.0–100.0)
Platelets: 303 10*3/uL (ref 150–400)
RBC: 4.75 MIL/uL (ref 3.87–5.11)
RDW: 14.2 % (ref 11.5–15.5)
WBC: 10.3 10*3/uL (ref 4.0–10.5)
nRBC: 0 % (ref 0.0–0.2)

## 2023-03-28 MED ORDER — METOPROLOL TARTRATE 25 MG PO TABS
50.0000 mg | ORAL_TABLET | Freq: Two times a day (BID) | ORAL | 2 refills | Status: AC
Start: 2023-03-28 — End: 2023-04-27

## 2023-03-28 MED ORDER — NAPROXEN 500 MG PO TABS: 500.0000 mg | ORAL_TABLET | Freq: Two times a day (BID) | ORAL | 0 refills | Status: AC

## 2023-03-28 NOTE — Discharge Instructions (Signed)
Your testing has been reassuring, I suspect that your pain in your leg is related to something called sciatica, please read the attached instructions and follow-up with your spinal doctors that did your surgery  Take the Naprosyn twice a day for pain, you have already been on the steroid so another steroid will not help you at this point.  I have also increased your metoprolol to twice a day and increase the dose to 50 mg.  Please take your blood pressure daily and share this with your doctor, you should be seen by your family doctor within 3 days for recheck.  Thank you for allowing [place] to treat you in the emergency department today.  After reviewing your examination and potential testing that was done it appears that you are safe to go home.  I would like for you to follow-up with your doctor within the next several days, have them obtain your records and follow-up with them to review all potential tests and results from your visit.  If you should develop severe or worsening symptoms return to the emergency department immediately

## 2023-03-28 NOTE — ED Triage Notes (Signed)
C/o chest tightness, lower left leg pain and swelling Also c/o bp182/100.  Pt reports ultrasound performed on left leg to r/o DVT on 10/14 but haven't got results back yet.  Hx HTN and svt with no missed doses of meds.

## 2023-03-28 NOTE — ED Provider Notes (Signed)
[place] EMERGENCY DEPARTMENT AT [facility] Provider Note   CSN: [id number] Arrival date & time: 03/28/23  1729     History  Chief Complaint  Patient presents with   Chest Pain   Hypertension    Catherine Hansen is a 62 y.o. female.   Chest Pain Associated symptoms: no back pain   Hypertension   This patient is a 62 year old female, she has a history of hypertension taking 50 mg of spironolactone per day as well as metoprolol 25 mg/day.  She has no other history of significant hypertension and she is not a diabetic.  She has had back surgery in the past at the [place] [facility] [place], she reports that over the last couple of weeks she has had pain in her left buttock radiating down her left leg, she actually had an ultrasound done at an outside hospital for a blood clot but was never told the results, she denies any swelling of this leg.  She reports that the pain radiates down the posterior aspect of the leg all the way to her foot and she often has tingling and burning sensation in her toes on that side.  No other injuries, she denies back pain, denies fevers or chills nausea or vomiting.  I was able to access the outside records and it shows that there was no evidence of DVT, she had a MRI of her lumbar back in September 2024, it showed postsurgical changes of L3-L5 with a spinal fusion without any hardware adverse features, no other acute findings  The patient denies having any chest pain or shortness of breath for me, she is more concerned about her leg, she does report that her blood pressure has been elevated today, here the blood pressure is 149/104, she is not tachycardic on my exam.    Home Medications Prior to Admission medications   Medication Sig Start Date End Date Taking? Authorizing Provider  naproxen (NAPROSYN) 500 MG tablet Take 1 tablet (500 mg total) by mouth 2 (two) times daily with a meal. 03/28/23  Yes Eber Hong, MD  albuterol (PROVENTIL  HFA;VENTOLIN HFA) 108 (90 Base) MCG/ACT inhaler Inhale 2 puffs into the lungs every 4 (four) hours as needed for wheezing or shortness of breath.    [provider]  Ascorbic Acid (VITAMIN C) 1000 MG tablet Take 1,000 mg by mouth daily.    [provider]  aspirin 81 MG tablet Take 81 mg by mouth daily.    [provider]  B Complex Vitamins (VITAMIN B COMPLEX PO) Take 1,000 mg by mouth daily.     [provider]  cetirizine (ZYRTEC) 10 MG tablet Take 10 mg by mouth daily.    [provider]  COD LIVER OIL PO Take 1 capsule by mouth daily.     [provider]  Coenzyme Q10 (COQ-10 PO) Take 1 capsule by mouth daily.     [provider]  ELDERBERRY PO Take 1 tablet by mouth daily.     [provider]  fluticasone (FLONASE) 50 MCG/ACT nasal spray Place 2 sprays into both nostrils daily.    [provider]  furosemide (LASIX) 40 MG tablet Take 40 mg by mouth daily.     [provider]  metoprolol tartrate (LOPRESSOR) 25 MG tablet Take 2 tablets (50 mg total) by mouth 2 (two) times daily. 03/28/23 04/27/23  Eber Hong, MD  pravastatin (PRAVACHOL) 20 MG tablet Take 20 mg by mouth daily.  [provider]  spironolactone (ALDACTONE) 50 MG tablet Take 50 mg by mouth daily.    [provider]  valACYclovir (VALTREX) 1000 MG tablet TAKE 1 TABLET BY MOUTH DAILY 01/08/23   Cyril Mourning A, NP      Allergies    Diphenhydramine, Hctz [hydrochlorothiazide], Lisinopril, Other, and Tuberculin tests    Review of Systems   Review of Systems  Cardiovascular:  Negative for leg swelling.  Musculoskeletal:  Negative for back pain, gait problem and joint swelling.  All other systems reviewed and are negative.   Physical Exam Updated Vital Signs BP (!) 149/104   Pulse (!) 112   Temp 98.4 F (36.9 C) (Oral)   Resp 20   Wt 109.3 kg   LMP 02/08/2013 (Exact Date)   SpO2 98%   BMI 36.11 kg/m   Physical Exam Vitals and nursing note reviewed.  Constitutional:      General: She is not in acute distress.    Appearance: She is well-developed.  HENT:     Head: Normocephalic and atraumatic.     Mouth/Throat:     Pharynx: No oropharyngeal exudate.  Eyes:     General: No scleral icterus.       Right eye: No discharge.        Left eye: No discharge.     Conjunctiva/sclera: Conjunctivae normal.     Pupils: Pupils are equal, round, and reactive to light.  Neck:     Thyroid: No thyromegaly.     Vascular: No JVD.  Cardiovascular:     Rate and Rhythm: Normal rate and regular rhythm.     Heart sounds: Normal heart sounds. No murmur heard.    No friction rub. No gallop.  Pulmonary:     Effort: Pulmonary effort is normal. No respiratory distress.     Breath sounds: Normal breath sounds. No wheezing or rales.  Abdominal:     General: Bowel sounds are normal. There is no distension.     Palpations: Abdomen is soft. There is no mass.     Tenderness: There is no abdominal tenderness.  Musculoskeletal:        General: No tenderness. Normal range of motion.     Cervical back: Normal range of motion and neck supple.     Comments: The patient has pain with flexion of the hip with the knee in extension, there is no tenderness in the lower back, she has normal sensation of the bilateral lower extremities and there is no edema of the lower extremities, normal pulses at the feet  Lymphadenopathy:     Cervical: No cervical adenopathy.  Skin:    General: Skin is warm and dry.     Findings: No erythema or rash.  Neurological:     Mental Status: She is alert.     Coordination: Coordination normal.  Psychiatric:        Behavior: Behavior normal.     ED Results / Procedures / Treatments   Labs (all labs ordered are listed, but only abnormal results are displayed) Labs Reviewed  BASIC METABOLIC PANEL - Abnormal; Notable for the following components:      Result Value   Glucose, Bld 107  (*)    All other components within normal limits  TROPONIN I (HIGH SENSITIVITY) - Abnormal; Notable for the following components:   Troponin I (High Sensitivity) 21 (*)    All other components within normal limits  CBC    EKG EKG Interpretation Date/Time:  Sunday March 28 2023 17:46:59 EDT Ventricular Rate:  102 PR Interval:  160 QRS Duration:  68 QT Interval:  340 QTC Calculation: 443 R Axis:   42  Text Interpretation: Sinus tachycardia Possible Left atrial enlargement Borderline ECG When compared with ECG of 13-Dec-2019 17:52, No significant change was found Confirmed by Eber Hong ([id number]) on 03/28/2023 5:53:48 PM  Radiology DG Chest 2 View  Result Date: 03/28/2023 CLINICAL DATA:  Chest pain. Chest tightness. Lower left leg pain and swelling. Elevated blood pressure. EXAM: CHEST - 2 VIEW COMPARISON:  06/10/2013 FINDINGS: Slightly shallow inspiration. Heart size and pulmonary vascularity are normal. Lungs are clear. No pleural effusions. No pneumothorax. Mediastinal contours appear intact. IMPRESSION: No active cardiopulmonary disease. Electronically Signed   By: Burman Nieves M.D.   On: 03/28/2023 19:32    Procedures Procedures    Medications Ordered in ED Medications - No data to display  ED Course/ Medical Decision Making/ A&P                                 Medical Decision Making Amount and/or Complexity of Data Reviewed Labs: ordered. Radiology: ordered.    This patient presents to the ED for concern of leg pain which is likely sciatica, she is not having chest pain or shortness of breath, she does have severe hypertension today but is better now at 149/104.  She is currently on an extremely low dose of metoprolol differential diagnosis includes hide tension, sciatica, unlikely to be DVT    Additional history obtained:  Additional history obtained from outside hospital External records from outside source obtained and reviewed including neck of DVT  study, negative MRI   Lab Tests:  I Ordered, and personally interpreted labs.  The pertinent results include: CBC and metabolic panel unremarkable, troponin 20   Imaging Studies ordered:  I ordered imaging studies including CXR  I independently visualized and interpreted imaging which showed no acute findings. I agree with the radiologist interpretation   Medicines ordered and prescription drug management:  I ordered medication including Percocet for pain Reevaluation of the patient after these medicines showed that the patient improved I have reviewed the patients home medicines and have made adjustments as needed   Problem List / ED Course:  Patient has hypertensive needs, she is not in crisis, she has no findings on her EKG of concern relating to ischemia and in fact the EKG which I have personally reviewed shows no significant changes from prior.  Her heart rate was about 102.  She has no chest pain or shortness of breath to suggest that this is something worsened, she has chronic leg pain over the last few weeks which appears to be related to sciatica most likely.  She has already been on a course of steroids according to her report, I will add an anti-inflammatory and a single dose of Percocet, she can follow-up with her spinal doctors   Social Determinants of Health:  None           Final Clinical Impression(s) / ED Diagnoses Final diagnoses:  Primary hypertension  Sciatica of left side    Rx / DC Orders ED Discharge Orders          Ordered    naproxen (NAPROSYN) 500 MG tablet  2 times daily with meals        03/28/23 1949    metoprolol tartrate (LOPRESSOR) 25 MG tablet  2 times daily  03/28/23 1949              Eber Hong, MD 03/28/23 1950

## 2024-01-18 ENCOUNTER — Other Ambulatory Visit: Payer: Self-pay | Admitting: Adult Health
